# Patient Record
Sex: Female | Born: 1960 | Race: White | Hispanic: No | Smoking: Current every day smoker
Health system: Southern US, Community
[De-identification: ages and names within clinical notes are randomized; demographics above are authoritative.]

## PROBLEM LIST (undated history)

## (undated) DIAGNOSIS — J439 Emphysema, unspecified: Secondary | ICD-10-CM

## (undated) HISTORY — DX: Emphysema, unspecified: J43.9

---

## 2019-05-19 ENCOUNTER — Emergency Department (HOSPITAL_COMMUNITY): Payer: Medicaid Other

## 2019-05-19 ENCOUNTER — Emergency Department (HOSPITAL_COMMUNITY)
Admission: EM | Admit: 2019-05-19 | Discharge: 2019-05-19 | Disposition: A | Discharge status: Home / Self Care | Payer: Medicaid Other | Attending: Emergency Medicine | Admitting: Emergency Medicine

## 2019-05-19 ENCOUNTER — Other Ambulatory Visit: Payer: Self-pay

## 2019-05-19 DIAGNOSIS — Z20822 Contact with and (suspected) exposure to covid-19: Secondary | ICD-10-CM | POA: Diagnosis not present

## 2019-05-19 DIAGNOSIS — R0602 Shortness of breath: Secondary | ICD-10-CM | POA: Diagnosis present

## 2019-05-19 DIAGNOSIS — Z79899 Other long term (current) drug therapy: Secondary | ICD-10-CM | POA: Insufficient documentation

## 2019-05-19 DIAGNOSIS — J069 Acute upper respiratory infection, unspecified: Secondary | ICD-10-CM

## 2019-05-19 LAB — CBC WITH DIFFERENTIAL/PLATELET
Abs Immature Granulocytes: 0.02 10*3/uL (ref 0.00–0.07)
Basophils Absolute: 0.1 10*3/uL (ref 0.0–0.1)
Basophils Relative: 1 %
Eosinophils Absolute: 0.2 10*3/uL (ref 0.0–0.5)
Eosinophils Relative: 2 %
HCT: 46.2 % — ABNORMAL HIGH (ref 36.0–46.0)
Hemoglobin: 15.1 g/dL — ABNORMAL HIGH (ref 12.0–15.0)
Immature Granulocytes: 0 %
Lymphocytes Relative: 24 %
Lymphs Abs: 2 10*3/uL (ref 0.7–4.0)
MCH: 33 pg (ref 26.0–34.0)
MCHC: 32.7 g/dL (ref 30.0–36.0)
MCV: 100.9 fL — ABNORMAL HIGH (ref 80.0–100.0)
Monocytes Absolute: 0.7 10*3/uL (ref 0.1–1.0)
Monocytes Relative: 9 %
Neutro Abs: 5.3 10*3/uL (ref 1.7–7.7)
Neutrophils Relative %: 64 %
Platelets: 216 10*3/uL (ref 150–400)
RBC: 4.58 MIL/uL (ref 3.87–5.11)
RDW: 12.9 % (ref 11.5–15.5)
WBC: 8.3 10*3/uL (ref 4.0–10.5)
nRBC: 0 % (ref 0.0–0.2)

## 2019-05-19 LAB — RESPIRATORY PANEL BY RT PCR (FLU A&B, COVID)
Influenza A by PCR: NEGATIVE
Influenza B by PCR: NEGATIVE
SARS Coronavirus 2 by RT PCR: NEGATIVE

## 2019-05-19 LAB — BRAIN NATRIURETIC PEPTIDE: B Natriuretic Peptide: 34.4 pg/mL (ref 0.0–100.0)

## 2019-05-19 LAB — BASIC METABOLIC PANEL
Anion gap: 9 (ref 5–15)
BUN: 16 mg/dL (ref 6–20)
CO2: 27 mmol/L (ref 22–32)
Calcium: 9.3 mg/dL (ref 8.9–10.3)
Chloride: 106 mmol/L (ref 98–111)
Creatinine, Ser: 0.64 mg/dL (ref 0.44–1.00)
GFR calc Af Amer: >60 mL/min (ref >60)
GFR calc non Af Amer: >60 mL/min (ref >60)
Glucose, Bld: 103 mg/dL — ABNORMAL HIGH (ref 70–99)
Potassium: 4 mmol/L (ref 3.5–5.1)
Sodium: 142 mmol/L (ref 135–145)

## 2019-05-19 LAB — BLOOD GAS, VENOUS
Acid-Base Excess: 3 mmol/L — ABNORMAL HIGH (ref 0.0–2.0)
Bicarbonate: 28.2 mmol/L — ABNORMAL HIGH (ref 20.0–28.0)
O2 Saturation: 88.2 %
Patient temperature: 98.6
pCO2, Ven: 47 mmHg (ref 44.0–60.0)
pH, Ven: 7.394 (ref 7.250–7.430)
pO2, Ven: 55.1 mmHg — ABNORMAL HIGH (ref 32.0–45.0)

## 2019-05-19 LAB — POC SARS CORONAVIRUS 2 AG -  ED: SARS Coronavirus 2 Ag: NEGATIVE

## 2019-05-19 LAB — TROPONIN I (HIGH SENSITIVITY): Troponin I (High Sensitivity): 6 ng/L (ref <18)

## 2019-05-19 MED ORDER — MAGIC MOUTHWASH
10.0000 mL | Freq: Once | ORAL | Status: AC
  Administered 2019-05-19: 10 mL via ORAL
  Filled 2019-05-19: qty 10

## 2019-05-19 MED ORDER — BENZONATATE 100 MG PO CAPS: 100.0000 mg | ORAL_CAPSULE | Freq: Two times a day (BID) | ORAL | 0 refills | Status: AC | PRN

## 2019-05-19 MED ORDER — ALBUTEROL SULFATE HFA 108 (90 BASE) MCG/ACT IN AERS
6.0000 | INHALATION_SPRAY | Freq: Once | RESPIRATORY_TRACT | Status: AC
  Administered 2019-05-19: 6 via RESPIRATORY_TRACT
  Filled 2019-05-19: qty 6.7

## 2019-05-19 NOTE — Discharge Instructions (Addendum)
I suspect he may be suffering from a viral infection, which is worsening your chronic COPD.  Your blood work and your chest x-ray were reassuring today.  I did not see any urgent need for hospitalization.  Please follow-up with your primary care doctor about getting set up with a home oxygen.  I do believe you need this for your COPD, which is quite advanced.  I strongly recommend that you make every effort to quit smoking, as this will only continue worsening your condition.  You are under investigation still for COVID-19.  Please self quarantine at home until you get your final results, which may take another day or 2.  You can always call back in for your results or check online on your patient portal.

## 2019-05-19 NOTE — ED Provider Notes (Signed)
[facility]-EMERGENCY DEPT Provider Note   CSN: [id number] Arrival date & time: 05/19/19  1453     History Chief Complaint  Patient presents with  . Cough  . Sore Throat  . Emesis    Danielle Wong is a 59 y.o. female history of COPD on 2 L nasal cannula as needed at baseline, emphysema, smoking, presenting to the emergency department with cough and congestion and sore throat.  She reports symptoms ongoing for approximately 2 weeks.  She reports a loss of taste.  She says she has had a bad sore throat and also been nauseated and vomited a few times.  Says she had a very persistent cough as well as heaviness in her chest.  Initially the heaviness was only associated with coughing, but for the past day or 2 has become persistent.  She feels short of breath all the time.  She has been using her nebulizer machine with little improvement.  She says that she has not had her oxygen machine since moving here from [place] approximately 2 weeks ago.  She only used it as needed at home.  She continues to smoke about half a pack of cigarettes per day.  She is been a lifelong heavy smoker.  She denies any history of heart attack or cardiac stents.   She reports a history of high blood pressure.    NKDA  HPI     No past medical history on file.  There are no problems to display for this patient.    OB History   No obstetric history on file.     No family history on file.  Social History   Tobacco Use  . Smoking status: Not on file  Substance Use Topics  . Alcohol use: Not on file  . Drug use: Not on file    Home Medications Prior to Admission medications   Medication Sig Start Date End Date Taking? Authorizing Provider  albuterol (VENTOLIN HFA) 108 (90 Base) MCG/ACT inhaler Inhale 2 puffs into the lungs every 6 (six) hours as needed for wheezing or shortness of breath.   Yes [provider]  ALPRAZolam Prudy Feeler) 1 MG tablet Take 1 mg by mouth 3  (three) times daily.  01/03/19  Yes [provider]  aspirin-acetaminophen-caffeine (EXCEDRIN MIGRAINE) [id number] MG tablet Take 2 tablets by mouth every 6 (six) hours as needed for headache.   Yes [provider]  DULoxetine (CYMBALTA) 60 MG capsule Take 60 mg by mouth daily.  02/15/19 03/06/20 Yes [provider]  esomeprazole (NEXIUM) 40 MG capsule Take 40 mg by mouth daily.  01/17/19  Yes [provider]  fluticasone (FLOVENT HFA) 110 MCG/ACT inhaler Inhale 2 puffs into the lungs in the morning and at bedtime.  05/14/19  Yes [provider]  QUEtiapine (SEROQUEL) 200 MG tablet Take 600 mg by mouth at bedtime.   Yes [provider]  SUMAtriptan (IMITREX) 50 MG tablet Take 50 mg by mouth every 2 (two) hours as needed for migraine or headache.  02/26/19  Yes [provider]  benzonatate (TESSALON) 100 MG capsule Take 1 capsule (100 mg total) by mouth 2 (two) times daily as needed for up to 30 doses for cough. 05/19/19   Terald Sleeper, MD    Allergies    Topamax [topiramate]  Review of Systems   Review of Systems  Constitutional: Positive for appetite change, chills, fatigue and fever.  HENT: Positive for congestion, sore throat and trouble swallowing.  Respiratory: Positive for cough, chest tightness and shortness of breath.   Cardiovascular: Negative for chest pain and palpitations.  Gastrointestinal: Positive for nausea and vomiting. Negative for abdominal pain.  Genitourinary: Negative for dysuria and hematuria.  Musculoskeletal: Positive for arthralgias and myalgias.  Skin: Negative for color change and rash.  Neurological: Positive for light-headedness and headaches. Negative for seizures and syncope.  All other systems reviewed and are negative.   Physical Exam Updated Vital Signs BP (!) 157/101   Pulse 83   Temp 98.2 F (36.8 C) (Oral)   Resp (!) 22   Ht 5\' 5"  (1.651 m)   Wt 54.4 kg   SpO2 (!) 89%   BMI  19.97 kg/m   Physical Exam Vitals and nursing note reviewed.  Constitutional:      General: She is not in acute distress.    Appearance: She is well-developed.  HENT:     Head: Normocephalic and atraumatic.     Comments: Oropharynx clear, no tonsillar exudates or swelling, uvula midline, voice is non-muffled and clear, no drooling Eyes:     Conjunctiva/sclera: Conjunctivae normal.  Cardiovascular:     Rate and Rhythm: Normal rate and regular rhythm.     Heart sounds: Normal heart sounds.  Pulmonary:     Effort: Pulmonary effort is normal.     Comments: Moving air poorly in lungs Speaking comfortably in full sentences No crackles Mild expiratory wheezing Abdominal:     Palpations: Abdomen is soft.     Tenderness: There is no abdominal tenderness.  Musculoskeletal:     Cervical back: Neck supple.  Skin:    General: Skin is warm and dry.  Neurological:     Mental Status: She is alert.     ED Results / Procedures / Treatments   Labs (all labs ordered are listed, but only abnormal results are displayed) Labs Reviewed  CBC WITH DIFFERENTIAL/PLATELET - Abnormal; Notable for the following components:      Result Value   Hemoglobin 15.1 (*)    HCT 46.2 (*)    MCV 100.9 (*)    All other components within normal limits  BASIC METABOLIC PANEL - Abnormal; Notable for the following components:   Glucose, Bld 103 (*)    All other components within normal limits  BLOOD GAS, VENOUS - Abnormal; Notable for the following components:   pO2, Ven 55.1 (*)    Bicarbonate 28.2 (*)    Acid-Base Excess 3.0 (*)    All other components within normal limits  RESPIRATORY PANEL BY RT PCR (FLU A&B, COVID)  BRAIN NATRIURETIC PEPTIDE  POC SARS CORONAVIRUS 2 AG -  ED  I-STAT VENOUS BLOOD GAS, ED  TROPONIN I (HIGH SENSITIVITY)    EKG None  Radiology DG Chest Portable 1 View  Result Date: 05/19/2019 CLINICAL DATA:  Short of breath.  Low-grade fever. EXAM: PORTABLE CHEST 1 VIEW COMPARISON:   None. FINDINGS: Cardiac silhouette normal in size. No mediastinal or hilar masses. No evidence of adenopathy. Lungs are hyperexpanded, but clear. No pleural effusion or pneumothorax. Skeletal structures are grossly intact. IMPRESSION: 1. No acute cardiopulmonary disease. 2. Hyperexpanded lungs suggests COPD. Electronically Signed   By: 05/21/2019 M.D.   On: 05/19/2019 17:00    Procedures Procedures (including critical care time)  Medications Ordered in ED Medications  albuterol (VENTOLIN HFA) 108 (90 Base) MCG/ACT inhaler 6 puff (6 puffs Inhalation Given 05/19/19 1804)  magic mouthwash (10 mLs Oral Given 05/19/19 1807)    ED Course  I have reviewed the triage vital signs and the nursing notes.  Pertinent labs & imaging results that were available during my care of the patient were reviewed by me and considered in my medical decision making (see chart for details).  59 year old female COPD presenting to the ED with viral type syndrome for the past 2 weeks.  She feels great COPD is getting worse.  She also reports chest pressure for the past few days, usually associated with coughing but not persistent.  She is not in acute respiratory distress.  She is stable on room air.  Obtain blood work including checking hemoglobin, EKG and troponin.  We will get a chest x-ray.  Her rapid Covid test was negative.  However still of a high suspicion for COVID-19 given her loss of smell and taste, will follow this up with a Covid PCR.  Given her medical comorbidities and her severe COPD or emphysema, believe it is reasonable to get a rapid Covid test to confirm this diagnosis.    Clinical Course as of May 18 2237  Sat May 19, 2019  1704 IMPRESSION: 1. No acute cardiopulmonary disease. 2. Hyperexpanded lungs suggests COPD.   [MT]  2029 Labs are unremarkable   [MT]  2046 Stable at 90% on room air resting in bed.  She tells me her walking pulse ox can be in the low 80's, this is clearly not a new  drop for her, but likely related to her significant COPD.  She'll call her PCP tomorrow to discuss home O2.   [MT]    Clinical Course User Index [MT] Langston Masker Carola Rhine, MD    Final Clinical Impression(s) / ED Diagnoses Final diagnoses:  Shortness of breath  Upper respiratory tract infection, unspecified type    Rx / DC Orders ED Discharge Orders         Ordered    benzonatate (TESSALON) 100 MG capsule  2 times daily PRN     05/19/19 2052           Wyvonnia Dusky, MD 05/19/19 2239

## 2019-05-19 NOTE — ED Notes (Signed)
Patient was verbalized discharge instructions. Pt had no further questions at this time. NAD. 

## 2019-05-19 NOTE — ED Triage Notes (Signed)
Per patient, she has COPD at baseline, states "this feels way worse than COPD". Patient reports sore throat, nausea, emesis, loss of taste. Patient reported low grade fever PTA. Patient VS WNL in triage. Temp 98.2

## 2019-05-19 NOTE — ED Notes (Signed)
Patient ambulated to restroom without assistance. Gait steady. 

## 2019-05-19 NOTE — Progress Notes (Addendum)
TOC CM spoke to pt and states her PCP, Dr Eusebio Friendly. Pt states she was on oxygen when she left [place], but sent oxygen back when she moved to [place]. Updated referring ED provider. Waiting oxygen saturation. Unable to verify for DME provider coverage from ED setting after hours for oxygen. Isidoro Donning RN CCM, [facility] ED TOC CM [phone]

## 2019-10-19 ENCOUNTER — Emergency Department (HOSPITAL_COMMUNITY): Payer: Medicaid Other

## 2019-10-19 ENCOUNTER — Other Ambulatory Visit: Payer: Self-pay

## 2019-10-19 ENCOUNTER — Inpatient Hospital Stay (HOSPITAL_COMMUNITY)
Admission: EM | Admit: 2019-10-19 | Discharge: 2019-10-23 | DRG: 064 | Disposition: A | Discharge status: Home Health | Payer: Medicaid Other | Attending: Internal Medicine | Admitting: Internal Medicine

## 2019-10-19 ENCOUNTER — Encounter (HOSPITAL_COMMUNITY): Payer: Self-pay | Admitting: Emergency Medicine

## 2019-10-19 DIAGNOSIS — S60221A Contusion of right hand, initial encounter: Secondary | ICD-10-CM | POA: Diagnosis present

## 2019-10-19 DIAGNOSIS — R609 Edema, unspecified: Secondary | ICD-10-CM

## 2019-10-19 DIAGNOSIS — J9621 Acute and chronic respiratory failure with hypoxia: Secondary | ICD-10-CM | POA: Diagnosis not present

## 2019-10-19 DIAGNOSIS — I6340 Cerebral infarction due to embolism of unspecified cerebral artery: Secondary | ICD-10-CM | POA: Diagnosis present

## 2019-10-19 DIAGNOSIS — F1410 Cocaine abuse, uncomplicated: Secondary | ICD-10-CM | POA: Diagnosis not present

## 2019-10-19 DIAGNOSIS — S40211A Abrasion of right shoulder, initial encounter: Secondary | ICD-10-CM | POA: Diagnosis present

## 2019-10-19 DIAGNOSIS — R112 Nausea with vomiting, unspecified: Secondary | ICD-10-CM

## 2019-10-19 DIAGNOSIS — F17210 Nicotine dependence, cigarettes, uncomplicated: Secondary | ICD-10-CM | POA: Diagnosis present

## 2019-10-19 DIAGNOSIS — F1310 Sedative, hypnotic or anxiolytic abuse, uncomplicated: Secondary | ICD-10-CM | POA: Diagnosis present

## 2019-10-19 DIAGNOSIS — Z79899 Other long term (current) drug therapy: Secondary | ICD-10-CM

## 2019-10-19 DIAGNOSIS — I6389 Other cerebral infarction: Secondary | ICD-10-CM | POA: Diagnosis not present

## 2019-10-19 DIAGNOSIS — I10 Essential (primary) hypertension: Secondary | ICD-10-CM | POA: Diagnosis present

## 2019-10-19 DIAGNOSIS — R296 Repeated falls: Secondary | ICD-10-CM | POA: Diagnosis present

## 2019-10-19 DIAGNOSIS — N179 Acute kidney failure, unspecified: Secondary | ICD-10-CM | POA: Diagnosis present

## 2019-10-19 DIAGNOSIS — J9811 Atelectasis: Secondary | ICD-10-CM | POA: Diagnosis present

## 2019-10-19 DIAGNOSIS — Z720 Tobacco use: Secondary | ICD-10-CM | POA: Diagnosis present

## 2019-10-19 DIAGNOSIS — W19XXXA Unspecified fall, initial encounter: Secondary | ICD-10-CM | POA: Diagnosis present

## 2019-10-19 DIAGNOSIS — D72829 Elevated white blood cell count, unspecified: Secondary | ICD-10-CM | POA: Diagnosis present

## 2019-10-19 DIAGNOSIS — R569 Unspecified convulsions: Secondary | ICD-10-CM | POA: Diagnosis not present

## 2019-10-19 DIAGNOSIS — I639 Cerebral infarction, unspecified: Secondary | ICD-10-CM

## 2019-10-19 DIAGNOSIS — R413 Other amnesia: Secondary | ICD-10-CM | POA: Diagnosis present

## 2019-10-19 DIAGNOSIS — R680 Hypothermia, not associated with low environmental temperature: Secondary | ICD-10-CM | POA: Diagnosis present

## 2019-10-19 DIAGNOSIS — F1910 Other psychoactive substance abuse, uncomplicated: Secondary | ICD-10-CM | POA: Diagnosis present

## 2019-10-19 DIAGNOSIS — R4182 Altered mental status, unspecified: Secondary | ICD-10-CM | POA: Diagnosis not present

## 2019-10-19 DIAGNOSIS — G43909 Migraine, unspecified, not intractable, without status migrainosus: Secondary | ICD-10-CM | POA: Diagnosis present

## 2019-10-19 DIAGNOSIS — G8321 Monoplegia of upper limb affecting right dominant side: Secondary | ICD-10-CM | POA: Diagnosis present

## 2019-10-19 DIAGNOSIS — J439 Emphysema, unspecified: Secondary | ICD-10-CM | POA: Diagnosis present

## 2019-10-19 DIAGNOSIS — Z888 Allergy status to other drugs, medicaments and biological substances status: Secondary | ICD-10-CM

## 2019-10-19 DIAGNOSIS — S0011XA Contusion of right eyelid and periocular area, initial encounter: Secondary | ICD-10-CM | POA: Diagnosis present

## 2019-10-19 DIAGNOSIS — M25511 Pain in right shoulder: Secondary | ICD-10-CM | POA: Diagnosis present

## 2019-10-19 DIAGNOSIS — Z20822 Contact with and (suspected) exposure to covid-19: Secondary | ICD-10-CM | POA: Diagnosis present

## 2019-10-19 DIAGNOSIS — Y9259 Other trade areas as the place of occurrence of the external cause: Secondary | ICD-10-CM

## 2019-10-19 DIAGNOSIS — S60511A Abrasion of right hand, initial encounter: Secondary | ICD-10-CM | POA: Diagnosis present

## 2019-10-19 DIAGNOSIS — F419 Anxiety disorder, unspecified: Secondary | ICD-10-CM | POA: Diagnosis present

## 2019-10-19 DIAGNOSIS — Z0189 Encounter for other specified special examinations: Secondary | ICD-10-CM

## 2019-10-19 DIAGNOSIS — F14188 Cocaine abuse with other cocaine-induced disorder: Secondary | ICD-10-CM | POA: Diagnosis present

## 2019-10-19 DIAGNOSIS — R29700 NIHSS score 0: Secondary | ICD-10-CM | POA: Diagnosis present

## 2019-10-19 DIAGNOSIS — I776 Arteritis, unspecified: Secondary | ICD-10-CM | POA: Diagnosis present

## 2019-10-19 DIAGNOSIS — Z9981 Dependence on supplemental oxygen: Secondary | ICD-10-CM

## 2019-10-19 DIAGNOSIS — J40 Bronchitis, not specified as acute or chronic: Secondary | ICD-10-CM | POA: Diagnosis present

## 2019-10-19 DIAGNOSIS — E876 Hypokalemia: Secondary | ICD-10-CM | POA: Diagnosis present

## 2019-10-19 DIAGNOSIS — G248 Other dystonia: Secondary | ICD-10-CM | POA: Diagnosis present

## 2019-10-19 DIAGNOSIS — Z1389 Encounter for screening for other disorder: Secondary | ICD-10-CM

## 2019-10-19 DIAGNOSIS — G92 Toxic encephalopathy: Secondary | ICD-10-CM | POA: Diagnosis present

## 2019-10-19 DIAGNOSIS — H9190 Unspecified hearing loss, unspecified ear: Secondary | ICD-10-CM | POA: Diagnosis present

## 2019-10-19 DIAGNOSIS — G936 Cerebral edema: Secondary | ICD-10-CM | POA: Diagnosis present

## 2019-10-19 DIAGNOSIS — R7401 Elevation of levels of liver transaminase levels: Secondary | ICD-10-CM | POA: Diagnosis present

## 2019-10-19 DIAGNOSIS — S60811A Abrasion of right wrist, initial encounter: Secondary | ICD-10-CM | POA: Diagnosis present

## 2019-10-19 DIAGNOSIS — R270 Ataxia, unspecified: Secondary | ICD-10-CM | POA: Diagnosis present

## 2019-10-19 LAB — SALICYLATE LEVEL: Salicylate Lvl: <7 mg/dL — ABNORMAL LOW (ref 7.0–30.0)

## 2019-10-19 LAB — COMPREHENSIVE METABOLIC PANEL
ALT: 49 U/L — ABNORMAL HIGH (ref 0–44)
AST: 70 U/L — ABNORMAL HIGH (ref 15–41)
Albumin: 4.2 g/dL (ref 3.5–5.0)
Alkaline Phosphatase: 75 U/L (ref 38–126)
Anion gap: 15 (ref 5–15)
BUN: 33 mg/dL — ABNORMAL HIGH (ref 6–20)
CO2: 24 mmol/L (ref 22–32)
Calcium: 9 mg/dL (ref 8.9–10.3)
Chloride: 102 mmol/L (ref 98–111)
Creatinine, Ser: 1.37 mg/dL — ABNORMAL HIGH (ref 0.44–1.00)
GFR calc Af Amer: 49 mL/min — ABNORMAL LOW (ref >60)
GFR calc non Af Amer: 42 mL/min — ABNORMAL LOW (ref >60)
Glucose, Bld: 98 mg/dL (ref 70–99)
Potassium: 4.6 mmol/L (ref 3.5–5.1)
Sodium: 141 mmol/L (ref 135–145)
Total Bilirubin: 0.4 mg/dL (ref 0.3–1.2)
Total Protein: 7 g/dL (ref 6.5–8.1)

## 2019-10-19 LAB — URINALYSIS, ROUTINE W REFLEX MICROSCOPIC
Bacteria, UA: NONE SEEN
Bilirubin Urine: NEGATIVE
Glucose, UA: NEGATIVE mg/dL
Ketones, ur: 5 mg/dL — AB
Leukocytes,Ua: NEGATIVE
Nitrite: NEGATIVE
Protein, ur: NEGATIVE mg/dL
Specific Gravity, Urine: 1.013 (ref 1.005–1.030)
pH: 5 (ref 5.0–8.0)

## 2019-10-19 LAB — CBC
HCT: 45.1 % (ref 36.0–46.0)
Hemoglobin: 13.8 g/dL (ref 12.0–15.0)
MCH: 31.4 pg (ref 26.0–34.0)
MCHC: 30.6 g/dL (ref 30.0–36.0)
MCV: 102.5 fL — ABNORMAL HIGH (ref 80.0–100.0)
Platelets: 192 10*3/uL (ref 150–400)
RBC: 4.4 MIL/uL (ref 3.87–5.11)
RDW: 14.4 % (ref 11.5–15.5)
WBC: 14.6 10*3/uL — ABNORMAL HIGH (ref 4.0–10.5)
nRBC: 0 % (ref 0.0–0.2)

## 2019-10-19 LAB — I-STAT BETA HCG BLOOD, ED (MC, WL, AP ONLY): I-stat hCG, quantitative: <5 m[IU]/mL (ref <5)

## 2019-10-19 LAB — ETHANOL: Alcohol, Ethyl (B): <10 mg/dL (ref <10)

## 2019-10-19 LAB — RAPID URINE DRUG SCREEN, HOSP PERFORMED
Amphetamines: NOT DETECTED
Barbiturates: NOT DETECTED
Benzodiazepines: POSITIVE — AB
Cocaine: POSITIVE — AB
Opiates: NOT DETECTED
Tetrahydrocannabinol: NOT DETECTED

## 2019-10-19 LAB — SARS CORONAVIRUS 2 BY RT PCR (HOSPITAL ORDER, PERFORMED IN ~~LOC~~ HOSPITAL LAB): SARS Coronavirus 2: NEGATIVE

## 2019-10-19 LAB — CBG MONITORING, ED: Glucose-Capillary: 85 mg/dL (ref 70–99)

## 2019-10-19 LAB — ACETAMINOPHEN LEVEL: Acetaminophen (Tylenol), Serum: <10 ug/mL — ABNORMAL LOW (ref 10–30)

## 2019-10-19 MED ORDER — SODIUM CHLORIDE 0.9 % IV SOLN: INTRAVENOUS | Status: DC

## 2019-10-19 MED ORDER — ACETAMINOPHEN 650 MG RE SUPP: 650.0000 mg | RECTAL | Status: DC | PRN

## 2019-10-19 MED ORDER — METOCLOPRAMIDE HCL 5 MG/ML IJ SOLN
5.0000 mg | Freq: Once | INTRAMUSCULAR | Status: AC
  Administered 2019-10-19: 5 mg via INTRAVENOUS
  Filled 2019-10-19: qty 2

## 2019-10-19 MED ORDER — SENNOSIDES-DOCUSATE SODIUM 8.6-50 MG PO TABS: 1.0000 | ORAL_TABLET | Freq: Every evening | ORAL | Status: DC | PRN

## 2019-10-19 MED ORDER — LORAZEPAM 1 MG PO TABS
1.0000 mg | ORAL_TABLET | Freq: Four times a day (QID) | ORAL | Status: DC | PRN
  Administered 2019-10-20: 1 mg via ORAL
  Filled 2019-10-19: qty 1

## 2019-10-19 MED ORDER — ENOXAPARIN SODIUM 30 MG/0.3ML ~~LOC~~ SOLN
30.0000 mg | SUBCUTANEOUS | Status: DC
  Administered 2019-10-20 – 2019-10-22 (×3): 30 mg via SUBCUTANEOUS
  Filled 2019-10-19 (×3): qty 0.3

## 2019-10-19 MED ORDER — ATORVASTATIN CALCIUM 40 MG PO TABS
40.0000 mg | ORAL_TABLET | Freq: Every day | ORAL | Status: DC
  Administered 2019-10-20: 40 mg via ORAL
  Filled 2019-10-19: qty 1

## 2019-10-19 MED ORDER — ACETAMINOPHEN 325 MG PO TABS
650.0000 mg | ORAL_TABLET | ORAL | Status: DC | PRN
  Administered 2019-10-20 – 2019-10-21 (×3): 650 mg via ORAL
  Filled 2019-10-19 (×3): qty 2

## 2019-10-19 MED ORDER — STROKE: EARLY STAGES OF RECOVERY BOOK: Freq: Once | Status: DC

## 2019-10-19 MED ORDER — ONDANSETRON HCL 4 MG/2ML IJ SOLN
4.0000 mg | Freq: Once | INTRAMUSCULAR | Status: AC
  Administered 2019-10-19: 4 mg via INTRAVENOUS
  Filled 2019-10-19: qty 2

## 2019-10-19 MED ORDER — SODIUM CHLORIDE 0.9 % IV BOLUS
1000.0000 mL | Freq: Once | INTRAVENOUS | Status: AC
  Administered 2019-10-19: 1000 mL via INTRAVENOUS

## 2019-10-19 MED ORDER — ACETAMINOPHEN 160 MG/5ML PO SOLN: 650.0000 mg | ORAL | Status: DC | PRN
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
## 2019-10-19 NOTE — Consult Note (Signed)
Referring Physician: Dr. Freida Busman    Chief Complaint: AMS  HPI: Danielle Wong is an 59 y.o. female who presented to the [facility] ED from home via EMS on Friday afternoon. LKN was on Thursday at "supper time" per daughter. Her daughter attempted to contact the patient this morning unsuccessfully and then called EMS. EMS found patient sitting in a chair confused, only alert to name and birthday. Right eyebrow hematoma was noted. Per daughter, patient stated falling a lot on the day of admission. Daughter told EMS that the patient has a history of Xanax abuse and COPD. The patient has been staying at a hotel in [place] for the past 3 months since moving from [place]. She states she moved to [facility] for a "change of scene" and is looking for a permanent home here.   On arrival to the ED, the patient was disoriented, being unsure of current year as well as the current president.  She had difficulty answering questions, but was able to follow commands. The patient told EDP that she woke up in the morning feeling dizzy and had multiple falls but was unable to provide details. She also gave the same answers to the Neurologist, endorsing memory loss for recent events. She had multiple abrasions to her extremities in various stages of healing as well as a small hematoma above her right eye.   CT head revealed symmetric decreased attenuation in each posterior mid-cerebellar hemisphere. Possible edema was seen in the superolateral aspect of the left parietal lobe. There was concern for multifocal infarcts. Calcification was noted in the left carotid siphon region. Foci of carotid artery calcification were noted bilaterally in the neck.  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 Minimal chronic microvascular ischemic changes.  LSN: Thursday night tPA Given: No: Out of  time window  History reviewed. No pertinent past medical history.  History reviewed. No pertinent surgical history.  No family history on file. Social History:  has no history on file for tobacco use, alcohol use, and drug use.  Allergies:  Allergies  Allergen Reactions  . Topamax [Topiramate] Other (See Comments)    Dizziness     Home Medications:  No current facility-administered medications on file prior to encounter.   Current Outpatient Medications on File Prior to Encounter  Medication Sig Dispense Refill  . albuterol (VENTOLIN HFA) 108 (90 Base) MCG/ACT inhaler Inhale 2 puffs into the lungs every 6 (six) hours as needed for wheezing or shortness of breath.    . ALPRAZolam (XANAX) 1 MG tablet Take 1 mg by mouth 3 (three) times daily.     Marland Kitchen amLODipine (NORVASC) 5 MG tablet Take 5 mg by mouth daily.    Marland Kitchen aspirin-acetaminophen-caffeine (EXCEDRIN MIGRAINE) 250-250-65 MG tablet Take 2 tablets by mouth every 6 (six) hours as needed for headache.    . benzonatate (TESSALON) 100 MG capsule Take 1 capsule (100 mg total) by mouth 2 (two) times daily as needed for up to 30 doses for cough. 30 capsule 0  . DULoxetine (CYMBALTA) 60 MG capsule Take 60 mg by mouth 2 (two) times daily.     Marland Kitchen esomeprazole (NEXIUM) 40 MG capsule Take 40 mg by mouth daily.     . fluticasone (FLOVENT HFA) 110 MCG/ACT inhaler Inhale 2 puffs into the lungs 2 (two) times daily.     Marland Kitchen linaclotide (LINZESS) 72 MCG capsule Take 72 mcg by  mouth daily before breakfast.    . QUEtiapine (SEROQUEL) 200 MG tablet Take 600 mg by mouth at bedtime.    . SUMAtriptan (IMITREX) 50 MG tablet Take 50 mg by mouth every 2 (two) hours as needed for migraine or headache.        Hospital Scheduled Medications: .  stroke: mapping our early stages of recovery book   Does not apply Once  . atorvastatin  40 mg Oral Daily  . enoxaparin (LOVENOX) injection  30 mg Subcutaneous Q24H  . nicotine  21 mg Transdermal Daily   Continuous: .  sodium chloride 125 mL/hr at 10/20/19 0044    ROS: Has a bifrontal throbbing 10/10 headache. Endorses having lacerations and bruises, but cannot remember how she got them. Does not have any other complaints, but poor memory and insight limit accuracy of ROS.  Physical Examination: Blood pressure (!) 142/89, pulse 99, temperature 97.6 F (36.4 C), temperature source Oral, resp. rate (!) 22, weight 54.4 kg, SpO2 95 %.  HEENT: [place]/AT. No scleral injection or periorbital bruising. No bruises or abrasions to neck. Lungs: Respirations unlabored.  Ext: Abrasion and bruising to right hand/wrist as well as lower extremities. No pitting edema.   Neurologic Examination: Mental Status: Awake and alert. Not oriented to city, state, day of week, year or month. Appears confused and distressed when trying to recollect orientation information. Speech fluent and nondysarthric. Naming intact. Able to follow all basic commands. Could only perform 2 out of 3 steps of a directional multistep command. Anxious affect.  Cranial Nerves: II:  Visual fields intact bilaterally. No extinction to DSS. PERRL.  III,IV, VI: No ptosis. EOMI. No nystagmus.  V,VII: Smile symmetric, facial temp sensation equal bilaterally VIII: Hearing intact to voice IX,X: Palate rises symmetrically XI: Symmetric. Low amplitude head tremor is noted when sitting upright, but not when supine. Subtle dystonic posturing of neck at times.  XII: Midline tongue extension  Motor: RUE 4/5 proximally and distally with dystonic posturing of wrist and digits.  LUE 5/5 proximally and distally BLE 5/5 No pronator drift in the context of dystonic posturing of right hand.  Sensory: Temp subjectively intact x 4. Light touch sensation absent to RUE. Severely decreased pressure sensation to RUE. Light touch decreased to RLE. No extinction with DSS to lower extremities.   Deep Tendon Reflexes:  3+ bilateral brachioradialis and biceps 3+ bilateral  patellae Trace achilles bilaterally Toes equivocal bilaterally  Cerebellar: Dysmetric FNF with RUE. Slow but non-dysmetric left FNF. No ataxia with H-S bilaterally. Gait: Deferred  Results for orders placed or performed during the hospital encounter of 10/19/19 (from the past 48 hour(s))  Comprehensive metabolic panel     Status: Abnormal   Collection Time: 10/19/19  1:58 PM  Result Value Ref Range   Sodium 141 135 - 145 mmol/L   Potassium 4.6 3.5 - 5.1 mmol/L   Chloride 102 98 - 111 mmol/L   CO2 24 22 - 32 mmol/L   Glucose, Bld 98 70 - 99 mg/dL    Comment: Glucose reference range applies only to samples taken after fasting for at least 8 hours.   BUN 33 (H) 6 - 20 mg/dL   Creatinine, Ser 2.95 (H) 0.44 - 1.00 mg/dL   Calcium 9.0 8.9 - 62.1 mg/dL   Total Protein 7.0 6.5 - 8.1 g/dL   Albumin 4.2 3.5 - 5.0 g/dL   AST 70 (H) 15 - 41 U/L   ALT 49 (H) 0 - 44 U/L   Alkaline  Phosphatase 75 38 - 126 U/L   Total Bilirubin 0.4 0.3 - 1.2 mg/dL   GFR calc non Af Amer 42 (L) >60 mL/min   GFR calc Af Amer 49 (L) >60 mL/min   Anion gap 15 5 - 15    Comment: Performed at [facility], [address] [place], [place] [address]  CBC     Status: Abnormal   Collection Time: 10/19/19  1:58 PM  Result Value Ref Range   WBC 14.6 (H) 4.0 - 10.5 K/uL   RBC 4.40 3.87 - 5.11 MIL/uL   Hemoglobin 13.8 12.0 - 15.0 g/dL   HCT 60.445.1 36 - 46 %   MCV 102.5 (H) 80.0 - 100.0 fL   MCH 31.4 26.0 - 34.0 pg   MCHC 30.6 30.0 - 36.0 g/dL   RDW 54.014.4 98.111.5 - 19.115.5 %   Platelets 192 150 - 400 K/uL   nRBC 0.0 0.0 - 0.2 %    Comment: Performed at [facility], [address] [place], [place] [address]  Salicylate level     Status: Abnormal   Collection Time: 10/19/19  1:58 PM  Result Value Ref Range   Salicylate Lvl <7.0 (L) 7.0 - 30.0 mg/dL    Comment: Performed at [facility], [address] [place], [place] [address]  Ethanol     Status: None   Collection Time: 10/19/19  1:58 PM  Result Value  Ref Range   Alcohol, Ethyl (B) <10 <10 mg/dL    Comment: (NOTE) Lowest detectable limit for serum alcohol is 10 mg/dL.  For medical purposes only. Performed at [facility], [address] [place], [place] [address]   I-Stat beta hCG blood, ED     Status: None   Collection Time: 10/19/19  2:01 PM  Result Value Ref Range   I-stat hCG, quantitative <5.0 <5 mIU/mL   Comment 3            Comment:   GEST. AGE      CONC.  (mIU/mL)   <=1 WEEK        5 - 50     2 WEEKS       50 - 500     3 WEEKS       100 - 10,000     4 WEEKS     1,000 - 30,000        FEMALE AND NON-PREGNANT FEMALE:     LESS THAN 5 mIU/mL   CBG monitoring, ED     Status: None   Collection Time: 10/19/19  3:03 PM  Result Value Ref Range   Glucose-Capillary 85 70 - 99 mg/dL    Comment: Glucose reference range applies only to samples taken after fasting for at least 8 hours.  SARS Coronavirus 2 by RT PCR (hospital order, performed in [facility] hospital lab) Nasopharyngeal Nasopharyngeal Swab     Status: None   Collection Time: 10/19/19  4:02 PM   Specimen: Nasopharyngeal Swab  Result Value Ref Range   SARS Coronavirus 2 NEGATIVE NEGATIVE    Comment: (NOTE) SARS-CoV-2 target nucleic acids are NOT DETECTED.  The SARS-CoV-2 RNA is generally detectable in upper and lower respiratory specimens during the acute phase of infection. The lowest concentration of SARS-CoV-2 viral copies this assay can detect is 250 copies / mL. A negative result does not preclude SARS-CoV-2 infection and should not be used as the sole basis for treatment or other patient management  decisions.  A negative result may occur with improper specimen collection / handling, submission of specimen other than nasopharyngeal swab, presence of viral mutation(s) within the areas targeted by this assay, and inadequate number of viral copies (<250 copies / mL). A negative result must be combined with clinical observations, patient history, and  epidemiological information.  Fact Sheet for Patients:   BoilerBrush.com.cy  Fact Sheet for Healthcare Providers: https://pope.com/  This test is not yet approved or  cleared by the [place] FDA and has been authorized for detection and/or diagnosis of SARS-CoV-2 by FDA under an Emergency Use Authorization (EUA).  This EUA will remain in effect (meaning this test can be used) for the duration of the COVID-19 declaration under Section 564(b)(1) of the Act, 21 U.S.C. section 360bbb-3(b)(1), unless the authorization is terminated or revoked sooner.  Performed at [facility], [address], [place], [place] [address]   Urinalysis, Routine w reflex microscopic Urine, Catheterized     Status: Abnormal   Collection Time: 10/19/19  8:09 PM  Result Value Ref Range   Color, Urine YELLOW YELLOW   APPearance CLEAR CLEAR   Specific Gravity, Urine 1.013 1.005 - 1.030   pH 5.0 5.0 - 8.0   Glucose, UA NEGATIVE NEGATIVE mg/dL   Hgb urine dipstick SMALL (A) NEGATIVE   Bilirubin Urine NEGATIVE NEGATIVE   Ketones, ur 5 (A) NEGATIVE mg/dL   Protein, ur NEGATIVE NEGATIVE mg/dL   Nitrite NEGATIVE NEGATIVE   Leukocytes,Ua NEGATIVE NEGATIVE   RBC / HPF 0-5 0 - 5 RBC/hpf   WBC, UA 0-5 0 - 5 WBC/hpf   Bacteria, UA NONE SEEN NONE SEEN   Squamous Epithelial / LPF 0-5 0 - 5   Mucus PRESENT     Comment: Performed at [facility], [address], [place], [place] [address]  Urine rapid drug screen (hosp performed)     Status: Abnormal   Collection Time: 10/19/19  8:09 PM  Result Value Ref Range   Opiates NONE DETECTED NONE DETECTED   Cocaine POSITIVE (A) NONE DETECTED   Benzodiazepines POSITIVE (A) NONE DETECTED   Amphetamines NONE DETECTED NONE DETECTED   Tetrahydrocannabinol NONE DETECTED NONE DETECTED   Barbiturates NONE DETECTED NONE DETECTED    Comment: (NOTE) DRUG SCREEN FOR MEDICAL PURPOSES ONLY.  IF CONFIRMATION IS  NEEDED FOR ANY PURPOSE, NOTIFY LAB WITHIN 5 DAYS.  LOWEST DETECTABLE LIMITS FOR URINE DRUG SCREEN Drug Class                     Cutoff (ng/mL) Amphetamine and metabolites    1000 Barbiturate and metabolites    200 Benzodiazepine                 200 Tricyclics and metabolites     300 Opiates and metabolites        300 Cocaine and metabolites        300 THC                            50 Performed at [facility], [address], [place], [place] [address]   Acetaminophen level     Status: Abnormal   Collection Time: 10/19/19  9:59 PM  Result Value Ref Range   Acetaminophen (Tylenol), Serum <10 (L) 10 - 30 ug/mL    Comment: (NOTE) Therapeutic concentrations vary significantly. A range of 10-30 ug/mL  may be an effective concentration for  many patients. However, some  are best treated at concentrations outside of this range. Acetaminophen concentrations >150 ug/mL at 4 hours after ingestion  and >50 ug/mL at 12 hours after ingestion are often associated with  toxic reactions.  Performed at [facility], [address], [place], [place] [address]    DG Pelvis 1-2 Views  Result Date: 10/19/2019 CLINICAL DATA:  Metal screening prior to MRI EXAM: PELVIS - 1-2 VIEW COMPARISON:  None. FINDINGS: No metal foreign body projects over the pelvis. Upper normal amount of stool in the colon. No significant abnormal calcifications. No significant bony abnormality is observed. IMPRESSION: 1. No metal foreign body projects over the pelvis. 2. Upper normal amount of stool in the colon. Electronically Signed   By: Gaylyn Rong M.D.   On: 10/19/2019 18:29   DG Shoulder Right  Result Date: 10/19/2019 CLINICAL DATA:  pain EXAM: RIGHT SHOULDER - 2+ VIEW COMPARISON:  None. FINDINGS: There is no evidence of fracture or dislocation. Mild AC joint degenerative changes. Soft tissues are unremarkable. IMPRESSION: No acute finding in the right shoulder. Electronically Signed   By: Emmaline Kluver M.D.   On: 10/19/2019 16:23   DG Abd 1 View  Result Date: 10/19/2019 CLINICAL DATA:  59 year old female with pre MRI radiograph. Evaluate for metallic foreign object. EXAM: ABDOMEN - 1 VIEW COMPARISON:  None. FINDINGS: No metallic foreign object noted in the abdomen. There is moderate stool throughout the colon. No bowel dilatation or evidence of obstruction. Linear radiopaque foci over the left renal silhouette may represent kidney stone. The osseous structures and soft tissues are unremarkable. IMPRESSION: No metallic foreign object in the abdomen. Electronically Signed   By: Elgie Collard M.D.   On: 10/19/2019 18:29   CT Head Wo Contrast  Result Date: 10/19/2019 CLINICAL DATA:  Pain following trauma.  Altered mental status EXAM: CT HEAD WITHOUT CONTRAST CT CERVICAL SPINE WITHOUT CONTRAST TECHNIQUE: Multidetector CT imaging of the head and cervical spine was performed following the standard protocol without intravenous contrast. Multiplanar CT image reconstructions of the cervical spine were also generated. COMPARISON:  None. FINDINGS: CT HEAD FINDINGS Brain: The ventricles and sulci are normal in size and configuration. There is no appreciable intracranial mass, hemorrhage, extra-axial fluid collection, or midline shift. There is symmetric decreased attenuation in the posterior mid cerebellar hemispheres bilaterally. There is subtle edema in the posterolateral aspect of the left parietal lobe. Concern for potential multifocal infarct given this appearance. Elsewhere brain parenchyma appears unremarkable. Vascular: No appreciable hyperdense vessel. There is calcification in the left carotid siphon. Skull: The bony calvarium appears intact. Sinuses/Orbits: There is diffuse opacification of the sphenoid sinus regions. There is mild mucosal thickening in several ethmoid air cells. No intraorbital lesions evident. Other: Mastoid air cells are clear. CT CERVICAL SPINE FINDINGS Alignment: There is  no appreciable spondylolisthesis. Skull base and vertebrae: Skull base and craniocervical junction regions appear normal. No appreciable fracture. No blastic or lytic bone lesions. Soft tissues and spinal canal: The prevertebral soft tissues and predental space regions are normal. There is no evident cord or canal hematoma. No paraspinous lesions are evident. Disc levels: There is moderate disc space narrowing at C4-5 and C5-6 with moderate narrowing of the anterior aspect of the C3-4 disc. There are anterior osteophytes at C3, C4, C5, and C6. There is facet hypertrophy at multiple levels. There is mild exit foraminal narrowing at C4-5 on the left with impression on the exiting nerve root. There is milder impression  on the exiting nerve roots at C5-6 bilaterally. There is no frank disc extrusion or stenosis. Upper chest: There is evidence of centrilobular emphysematous change in the upper lobes. No upper lung region edema or consolidation. Other: There are foci of carotid artery calcification bilaterally. IMPRESSION: CT head: Symmetric decreased attenuation in each posterior mid cerebellar hemisphere. Suspect edema in the superolateral aspect of the left parietal lobe. Concern for multifocal infarcts. MR correlation advised. No mass or hemorrhage. Calcification noted in the left carotid siphon region. Paranasal sinus disease noted with diffuse opacification of the sphenoid sinus region which may well be chronic given the overall appearance. CT cervical spine: No fracture or spondylolisthesis. Multilevel osteoarthritic change. No disc extrusion or stenosis. Upper lobe emphysematous change noted. Foci of carotid artery calcification noted bilaterally. Electronically Signed   By: Bretta Bang III M.D.   On: 10/19/2019 14:39   CT Cervical Spine Wo Contrast  Result Date: 10/19/2019 CLINICAL DATA:  Pain following trauma.  Altered mental status EXAM: CT HEAD WITHOUT CONTRAST CT CERVICAL SPINE WITHOUT CONTRAST  TECHNIQUE: Multidetector CT imaging of the head and cervical spine was performed following the standard protocol without intravenous contrast. Multiplanar CT image reconstructions of the cervical spine were also generated. COMPARISON:  None. FINDINGS: CT HEAD FINDINGS Brain: The ventricles and sulci are normal in size and configuration. There is no appreciable intracranial mass, hemorrhage, extra-axial fluid collection, or midline shift. There is symmetric decreased attenuation in the posterior mid cerebellar hemispheres bilaterally. There is subtle edema in the posterolateral aspect of the left parietal lobe. Concern for potential multifocal infarct given this appearance. Elsewhere brain parenchyma appears unremarkable. Vascular: No appreciable hyperdense vessel. There is calcification in the left carotid siphon. Skull: The bony calvarium appears intact. Sinuses/Orbits: There is diffuse opacification of the sphenoid sinus regions. There is mild mucosal thickening in several ethmoid air cells. No intraorbital lesions evident. Other: Mastoid air cells are clear. CT CERVICAL SPINE FINDINGS Alignment: There is no appreciable spondylolisthesis. Skull base and vertebrae: Skull base and craniocervical junction regions appear normal. No appreciable fracture. No blastic or lytic bone lesions. Soft tissues and spinal canal: The prevertebral soft tissues and predental space regions are normal. There is no evident cord or canal hematoma. No paraspinous lesions are evident. Disc levels: There is moderate disc space narrowing at C4-5 and C5-6 with moderate narrowing of the anterior aspect of the C3-4 disc. There are anterior osteophytes at C3, C4, C5, and C6. There is facet hypertrophy at multiple levels. There is mild exit foraminal narrowing at C4-5 on the left with impression on the exiting nerve root. There is milder impression on the exiting nerve roots at C5-6 bilaterally. There is no frank disc extrusion or stenosis.  Upper chest: There is evidence of centrilobular emphysematous change in the upper lobes. No upper lung region edema or consolidation. Other: There are foci of carotid artery calcification bilaterally. IMPRESSION: CT head: Symmetric decreased attenuation in each posterior mid cerebellar hemisphere. Suspect edema in the superolateral aspect of the left parietal lobe. Concern for multifocal infarcts. MR correlation advised. No mass or hemorrhage. Calcification noted in the left carotid siphon region. Paranasal sinus disease noted with diffuse opacification of the sphenoid sinus region which may well be chronic given the overall appearance. CT cervical spine: No fracture or spondylolisthesis. Multilevel osteoarthritic change. No disc extrusion or stenosis. Upper lobe emphysematous change noted. Foci of carotid artery calcification noted bilaterally. Electronically Signed   By: Bretta Bang III M.D.   On:  10/19/2019 14:39   MR BRAIN WO CONTRAST  Addendum Date: 10/19/2019   ADDENDUM REPORT: 10/19/2019 21:11 ADDENDUM: These results were called by telephone at the time of interpretation on 10/19/2019 at 8:56 Pm to provider Dr. Fredderick Phenix, who verbally acknowledged these results. Electronically Signed   By: Stana Bunting M.D.   On: 10/19/2019 21:11   Result Date: 10/19/2019 CLINICAL DATA:  Mental status change. EXAM: MRI HEAD WITHOUT CONTRAST TECHNIQUE: Multiplanar, multiecho pulse sequences of the brain and surrounding structures were obtained without intravenous contrast. COMPARISON:  10/19/2019 head CT. 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 Tiny foci of SWI signal dropout within the left parietal region may reflect petechial hemorrhages. Background scattered punctate T2 hyperintensities involving the supratentorial white matter likely reflect  chronic microvascular ischemic changes. No midline shift, ventriculomegaly or extra-axial fluid collection. No mass lesion. Vascular: Normal flow voids. Skull and upper cervical spine: Normal marrow signal. Sinuses/Orbits: Normal orbits. Sphenoid sinus disease. No mastoid effusion. Other: None. 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 Minimal chronic microvascular ischemic changes. Electronically Signed: By: Stana Bunting M.D. On: 10/19/2019 20:52   DG Chest Portable 1 View  Result Date: 10/19/2019 CLINICAL DATA:  COPD, confused EXAM: PORTABLE CHEST 1 VIEW COMPARISON:  None. FINDINGS: Chronic interstitial prominence. No new consolidation or edema. No pleural effusion. Stable cardiomediastinal contours. Possible enlargement of the main pulmonary artery. Normal heart size. IMPRESSION: No acute abnormality in the chest. Electronically Signed   By: Guadlupe Spanish M.D.   On: 10/19/2019 13:51    Assessment: 59 y.o. female presenting with AMS. MRI reveals multifocal DWI-positive lesions.  1. Exam reveals confusion, poor orientation, anterograde memory loss, low amplitude tremor, RUE weakness with dystonia, right side sensory loss, RUE ataxia and brisk reflexes. Multiple bruises and skin abrasions are also noted.  2. MRI brain: Multifocal acute DWI abnormalities involving the left parietal lobe, left centrum semiovale, bilateral globi pallidi, left hippocampus, right occipital lobe and bilateral cerebellar hemispheres. The lesions were interpreted as being due to ischemic infarctions by Radiology. However, the hippocampal lesion, bilateral globus pallidus lesions and bilateral strikingly symmetric cerebellar lesions are more consistent with an acute toxic or metabolic insult (severe hypoglycemia, transient severe hypoxia, carbon monoxide poisoning).   3.  Elevated transaminases. Elevated BUN and Cr. Leukocytosis.  4. Cocaine and benzodiazepine positive. RCVS is on the DDx for her acute DWI lesions given cocaine use.   Recommendations: 1. HgbA1c, fasting lipid panel 2. MRA of the brain without contrast 3. PT consult, OT consult, Speech consult 4. Echocardiogram 5. Carotid dopplers 6. Frequent neuro checks 7. Risk factor modification 8. Telemetry monitoring 9. Hold off on starting ASA until stroke work up is complete.  10. Carboxyhemoglobin level (ordered) 11. Further toxic/metabolic work up, including assessment of possible etiologies for her elevated transaminases, renal dysfunction and leukocytosis.    @Electronically  signed: Dr.  10/19/2019, 11:53 PM

## 2019-10-19 NOTE — ED Notes (Signed)
This RN to room due to pt family stating pt is trying to leave.Pt had pulled off leads and was sitting at the end of the bed. Pt assisted back in bed. Cleaned up bed and floor

## 2019-10-19 NOTE — ED Provider Notes (Signed)
Medical screening examination/treatment/procedure(s) were conducted as a shared visit with non-physician practitioner(s) and myself.  I personally evaluated the patient during the encounter.    59 year old female here with altered mental status.  Patient states that she takes 1 mg Xanax twice a day and has not been using extra.  Patient is sleepy but alert oriented x4.  Will image patient's head and neck as well as check blood work.   Lorre Nick, MD 10/19/19 1331

## 2019-10-19 NOTE — ED Notes (Signed)
This RN to room due to pt fussing at another RN and stating she is going to leave because she can not have water.Pt had pulled off leads and purewick and and was sitting at the end of the bed. Pt assisted back in bed.   Explained to pt that she still has test to be ran and we can not give water at this time.

## 2019-10-19 NOTE — ED Triage Notes (Signed)
Arrived via EMS from home. Daughter called EMS could not get in contact with patient today. LKW yesterday "Supper time" per daughter. EMS found patient sitting in a chair confusion only alert to name and birthday. Right eyebrow hematoma patient stated falling a lot today. Daughter told EMS history of Xanax abuse and COPD. C-collar placed prior to arrival.

## 2019-10-19 NOTE — H&P (Signed)
History and Physical   Danielle Wong IRS:[id number] DOB: 1961-01-14 DOA: 10/19/2019  Referring MD/NP/PA: Dr. Fredderick Phenix  PCP: [place], [place] R, PA-C   Outpatient Specialists: None  Patient coming from: Home  Chief Complaint: Altered mental status  HPI: Danielle Wong is a 59 y.o. female with medical history significant of polysubstance abuse, COPD, hypertension, anxiety disorder, migraine headaches who was last seen normal by her daughter last night.  She could not reach her today and called out to her place which is in [place] that she left side.  She could not get her to respond so EMS was dispatched.  Patient was found sitting in a chair confused not sure where she was.  Brought in to the ER where she was evaluated for altered mental status.  Initial work-up showed urine drug screen positive for benzo diazepam's as well as cocaine.  Head CT without contrast not very expressive but MRI of the brain showed multiple bilateral CVAs.  It appears like a shower type CVA.  Patient is more awake now and alert.  Very anxious.  No focal neurologic findings otherwise.  She has been admitted with these diffuse CVA for evaluation.  ED Course: Temperature 95.1, blood pressure 155/109 pulse 107 respiratory 22 oxygen sat 79% on room air currently 100% 2 L.  White count is 14.6 the rest of CBC within normal.  BUN 33 creatinine 1.37 and the rest of the chemistry within normal.  Urine drug screen positive for benzodiazepine and cocaine.  Urinalysis essentially negative.  Chest x-ray and x-ray of the abdomen showed no acute findings.  MRI of the brain shows multifocal acute infarct involving the left parietal lobe left centrum semiovale bilateral globi pallidi left hippocampus right occipital lobe and bilateral cerebellar hemispheres.  Patient will be admitted to the hospital for evaluation and treatment of acute CVA and polysubstance abuse.  Review of Systems: As per HPI otherwise 10 point review of systems negative.     History reviewed. No pertinent past medical history.  History reviewed. No pertinent surgical history.   has no history on file for tobacco use, alcohol use, and drug use.  Allergies  Allergen Reactions  . Topamax [Topiramate] Other (See Comments)    Dizziness     No family history on file.   Prior to Admission medications   Medication Sig Start Date End Date Taking? Authorizing Provider  albuterol (VENTOLIN HFA) 108 (90 Base) MCG/ACT inhaler Inhale 2 puffs into the lungs every 6 (six) hours as needed for wheezing or shortness of breath.    [provider]  ALPRAZolam Prudy Feeler) 1 MG tablet Take 1 mg by mouth 3 (three) times daily.  01/03/19   [provider]  amLODipine (NORVASC) 5 MG tablet Take 5 mg by mouth daily.    [provider]  aspirin-acetaminophen-caffeine (EXCEDRIN MIGRAINE) [phone] MG tablet Take 2 tablets by mouth every 6 (six) hours as needed for headache.    [provider]  benzonatate (TESSALON) 100 MG capsule Take 1 capsule (100 mg total) by mouth 2 (two) times daily as needed for up to 30 doses for cough. 05/19/19   Terald Sleeper, MD  DULoxetine (CYMBALTA) 60 MG capsule Take 60 mg by mouth 2 (two) times daily.  02/15/19 03/06/20  [provider]  esomeprazole (NEXIUM) 40 MG capsule Take 40 mg by mouth daily.  01/17/19   [provider]  fluticasone (FLOVENT HFA) 110 MCG/ACT inhaler Inhale 2 puffs into the lungs 2 (two) times daily.  05/14/19  [provider]  linaclotide (LINZESS) 72 MCG capsule Take 72 mcg by mouth daily before breakfast.    [provider]  QUEtiapine (SEROQUEL) 200 MG tablet Take 600 mg by mouth at bedtime.    [provider]  SUMAtriptan (IMITREX) 50 MG tablet Take 50 mg by mouth every 2 (two) hours as needed for migraine or headache.  02/26/19   [provider]    Physical Exam: Vitals:   10/19/19 2001 10/19/19 2143 10/19/19 2200 10/19/19 2326   BP:  (!) 155/109 (!) 144/87 (!) 142/89  Pulse:    99  Resp:    (!) 22  Temp: 97.6 F (36.4 C)     TempSrc: Oral     SpO2:    95%  Weight:          Constitutional: Very anxious, acutely ill looking, tremulous, mildly agitated Vitals:   10/19/19 2001 10/19/19 2143 10/19/19 2200 10/19/19 2326  BP:  (!) 155/109 (!) 144/87 (!) 142/89  Pulse:    99  Resp:    (!) 22  Temp: 97.6 F (36.4 C)     TempSrc: Oral     SpO2:    95%  Weight:       Eyes: PERRL, lids and conjunctivae normal ENMT: Mucous membranes are dry. Posterior pharynx clear of any exudate or lesions.Normal dentition.  Neck: normal, supple, no masses, no thyromegaly Respiratory: clear to auscultation bilaterally, no wheezing, no crackles. Normal respiratory effort. No accessory muscle use.  Cardiovascular: Sinus tachycardia no murmurs / rubs / gallops. No extremity edema. 2+ pedal pulses. No carotid bruits.  Abdomen: no tenderness, no masses palpated. No hepatosplenomegaly. Bowel sounds positive.  Musculoskeletal: no clubbing / cyanosis. No joint deformity upper and lower extremities. Good ROM, no contractures. Normal muscle tone.  Skin: no rashes, lesions, ulcers. No induration Neurologic: CN 2-12 grossly intact. Sensation intact, DTR normal. Strength 5/5 in all 4.  No visible focal deficit except for confusion Psychiatric: Very anxious mood and slightly agitated.  Tremulous and moving around in bed alert.  Pressured speech    Labs on Admission: I have personally reviewed following labs and imaging studies  CBC: Recent Labs  Lab 10/19/19 1358  WBC 14.6*  HGB 13.8  HCT 45.1  MCV 102.5*  PLT 192   Basic Metabolic Panel: Recent Labs  Lab 10/19/19 1358  NA 141  K 4.6  CL 102  CO2 24  GLUCOSE 98  BUN 33*  CREATININE 1.37*  CALCIUM 9.0   GFR: Estimated Creatinine Clearance: 38 mL/min (A) (by C-G formula based on SCr of 1.37 mg/dL (H)). Liver Function Tests: Recent Labs  Lab 10/19/19 1358  AST 70*   ALT 49*  ALKPHOS 75  BILITOT 0.4  PROT 7.0  ALBUMIN 4.2   No results for input(s): LIPASE, AMYLASE in the last 168 hours. No results for input(s): AMMONIA in the last 168 hours. Coagulation Profile: No results for input(s): INR, PROTIME in the last 168 hours. Cardiac Enzymes: No results for input(s): CKTOTAL, CKMB, CKMBINDEX, TROPONINI in the last 168 hours. BNP (last 3 results) No results for input(s): PROBNP in the last 8760 hours. HbA1C: No results for input(s): HGBA1C in the last 72 hours. CBG: Recent Labs  Lab 10/19/19 1503  GLUCAP 85   Lipid Profile: No results for input(s): CHOL, HDL, LDLCALC, TRIG, CHOLHDL, LDLDIRECT in the last 72 hours. Thyroid Function Tests: No results for input(s): TSH, T4TOTAL, FREET4, T3FREE, THYROIDAB in the last 72 hours. Anemia Panel: No  results for input(s): VITAMINB12, FOLATE, FERRITIN, TIBC, IRON, RETICCTPCT in the last 72 hours. Urine analysis:    Component Value Date/Time   COLORURINE YELLOW 10/19/2019 2009   APPEARANCEUR CLEAR 10/19/2019 2009   LABSPEC 1.013 10/19/2019 2009   PHURINE 5.0 10/19/2019 2009   GLUCOSEU NEGATIVE 10/19/2019 2009   HGBUR SMALL (A) 10/19/2019 2009   BILIRUBINUR NEGATIVE 10/19/2019 2009   KETONESUR 5 (A) 10/19/2019 2009   PROTEINUR NEGATIVE 10/19/2019 2009   NITRITE NEGATIVE 10/19/2019 2009   LEUKOCYTESUR NEGATIVE 10/19/2019 2009   Sepsis Labs: @LABRCNTIP (procalcitonin:4,lacticidven:4) ) Recent Results (from the past 240 hour(s))  SARS Coronavirus 2 by RT PCR (hospital order, performed in [facility] hospital lab) Nasopharyngeal Nasopharyngeal Swab     Status: None   Collection Time: 10/19/19  4:02 PM   Specimen: Nasopharyngeal Swab  Result Value Ref Range Status   SARS Coronavirus 2 NEGATIVE NEGATIVE Final    Comment: (NOTE) SARS-CoV-2 target nucleic acids are NOT DETECTED.  The SARS-CoV-2 RNA is generally detectable in upper and lower respiratory specimens during the acute phase of infection.  The lowest concentration of SARS-CoV-2 viral copies this assay can detect is 250 copies / mL. A negative result does not preclude SARS-CoV-2 infection and should not be used as the sole basis for treatment or other patient management decisions.  A negative result may occur with improper specimen collection / handling, submission of specimen other than nasopharyngeal swab, presence of viral mutation(s) within the areas targeted by this assay, and inadequate number of viral copies (<250 copies / mL). A negative result must be combined with clinical observations, patient history, and epidemiological information.  Fact Sheet for Patients:   10/21/19  Fact Sheet for Healthcare Providers: BoilerBrush.com.cy  This test is not yet approved or  cleared by the https://pope.com/ FDA and has been authorized for detection and/or diagnosis of SARS-CoV-2 by FDA under an Emergency Use Authorization (EUA).  This EUA will remain in effect (meaning this test can be used) for the duration of the COVID-19 declaration under Section 564(b)(1) of the Act, 21 U.S.C. section 360bbb-3(b)(1), unless the authorization is terminated or revoked sooner.  Performed at [facility], [address] [place], [place]      Radiological Exams on Admission: DG Pelvis 1-2 Views  Result Date: 10/19/2019 CLINICAL DATA:  Metal screening prior to MRI EXAM: PELVIS - 1-2 VIEW COMPARISON:  None. FINDINGS: No metal foreign body projects over the pelvis. Upper normal amount of stool in the colon. No significant abnormal calcifications. No significant bony abnormality is observed. IMPRESSION: 1. No metal foreign body projects over the pelvis. 2. Upper normal amount of stool in the colon. Electronically Signed   By: 10/21/2019 M.D.   On: 10/19/2019 18:29   DG Shoulder Right  Result Date: 10/19/2019 CLINICAL DATA:  pain EXAM: RIGHT SHOULDER - 2+ VIEW  COMPARISON:  None. FINDINGS: There is no evidence of fracture or dislocation. Mild AC joint degenerative changes. Soft tissues are unremarkable. IMPRESSION: No acute finding in the right shoulder. Electronically Signed   By: 10/21/2019 M.D.   On: 10/19/2019 16:23   DG Abd 1 View  Result Date: 10/19/2019 CLINICAL DATA:  59 year old female with pre MRI radiograph. Evaluate for metallic foreign object. EXAM: ABDOMEN - 1 VIEW COMPARISON:  None. FINDINGS: No metallic foreign object noted in the abdomen. There is moderate stool throughout the colon. No bowel dilatation or evidence of obstruction. Linear radiopaque foci over the left renal silhouette may  represent kidney stone. The osseous structures and soft tissues are unremarkable. IMPRESSION: No metallic foreign object in the abdomen. Electronically Signed   By: Elgie Collard M.D.   On: 10/19/2019 18:29   CT Head Wo Contrast  Result Date: 10/19/2019 CLINICAL DATA:  Pain following trauma.  Altered mental status EXAM: CT HEAD WITHOUT CONTRAST CT CERVICAL SPINE WITHOUT CONTRAST TECHNIQUE: Multidetector CT imaging of the head and cervical spine was performed following the standard protocol without intravenous contrast. Multiplanar CT image reconstructions of the cervical spine were also generated. COMPARISON:  None. FINDINGS: CT HEAD FINDINGS Brain: The ventricles and sulci are normal in size and configuration. There is no appreciable intracranial mass, hemorrhage, extra-axial fluid collection, or midline shift. There is symmetric decreased attenuation in the posterior mid cerebellar hemispheres bilaterally. There is subtle edema in the posterolateral aspect of the left parietal lobe. Concern for potential multifocal infarct given this appearance. Elsewhere brain parenchyma appears unremarkable. Vascular: No appreciable hyperdense vessel. There is calcification in the left carotid siphon. Skull: The bony calvarium appears intact. Sinuses/Orbits: There is  diffuse opacification of the sphenoid sinus regions. There is mild mucosal thickening in several ethmoid air cells. No intraorbital lesions evident. Other: Mastoid air cells are clear. CT CERVICAL SPINE FINDINGS Alignment: There is no appreciable spondylolisthesis. Skull base and vertebrae: Skull base and craniocervical junction regions appear normal. No appreciable fracture. No blastic or lytic bone lesions. Soft tissues and spinal canal: The prevertebral soft tissues and predental space regions are normal. There is no evident cord or canal hematoma. No paraspinous lesions are evident. Disc levels: There is moderate disc space narrowing at C4-5 and C5-6 with moderate narrowing of the anterior aspect of the C3-4 disc. There are anterior osteophytes at C3, C4, C5, and C6. There is facet hypertrophy at multiple levels. There is mild exit foraminal narrowing at C4-5 on the left with impression on the exiting nerve root. There is milder impression on the exiting nerve roots at C5-6 bilaterally. There is no frank disc extrusion or stenosis. Upper chest: There is evidence of centrilobular emphysematous change in the upper lobes. No upper lung region edema or consolidation. Other: There are foci of carotid artery calcification bilaterally. IMPRESSION: CT head: Symmetric decreased attenuation in each posterior mid cerebellar hemisphere. Suspect edema in the superolateral aspect of the left parietal lobe. Concern for multifocal infarcts. MR correlation advised. No mass or hemorrhage. Calcification noted in the left carotid siphon region. Paranasal sinus disease noted with diffuse opacification of the sphenoid sinus region which may well be chronic given the overall appearance. CT cervical spine: No fracture or spondylolisthesis. Multilevel osteoarthritic change. No disc extrusion or stenosis. Upper lobe emphysematous change noted. Foci of carotid artery calcification noted bilaterally. Electronically Signed   By: Bretta Bang III M.D.   On: 10/19/2019 14:39   CT Cervical Spine Wo Contrast  Result Date: 10/19/2019 CLINICAL DATA:  Pain following trauma.  Altered mental status EXAM: CT HEAD WITHOUT CONTRAST CT CERVICAL SPINE WITHOUT CONTRAST TECHNIQUE: Multidetector CT imaging of the head and cervical spine was performed following the standard protocol without intravenous contrast. Multiplanar CT image reconstructions of the cervical spine were also generated. COMPARISON:  None. FINDINGS: CT HEAD FINDINGS Brain: The ventricles and sulci are normal in size and configuration. There is no appreciable intracranial mass, hemorrhage, extra-axial fluid collection, or midline shift. There is symmetric decreased attenuation in the posterior mid cerebellar hemispheres bilaterally. There is subtle edema in the posterolateral aspect of the left parietal  lobe. Concern for potential multifocal infarct given this appearance. Elsewhere brain parenchyma appears unremarkable. Vascular: No appreciable hyperdense vessel. There is calcification in the left carotid siphon. Skull: The bony calvarium appears intact. Sinuses/Orbits: There is diffuse opacification of the sphenoid sinus regions. There is mild mucosal thickening in several ethmoid air cells. No intraorbital lesions evident. Other: Mastoid air cells are clear. CT CERVICAL SPINE FINDINGS Alignment: There is no appreciable spondylolisthesis. Skull base and vertebrae: Skull base and craniocervical junction regions appear normal. No appreciable fracture. No blastic or lytic bone lesions. Soft tissues and spinal canal: The prevertebral soft tissues and predental space regions are normal. There is no evident cord or canal hematoma. No paraspinous lesions are evident. Disc levels: There is moderate disc space narrowing at C4-5 and C5-6 with moderate narrowing of the anterior aspect of the C3-4 disc. There are anterior osteophytes at C3, C4, C5, and C6. There is facet hypertrophy at multiple  levels. There is mild exit foraminal narrowing at C4-5 on the left with impression on the exiting nerve root. There is milder impression on the exiting nerve roots at C5-6 bilaterally. There is no frank disc extrusion or stenosis. Upper chest: There is evidence of centrilobular emphysematous change in the upper lobes. No upper lung region edema or consolidation. Other: There are foci of carotid artery calcification bilaterally. IMPRESSION: CT head: Symmetric decreased attenuation in each posterior mid cerebellar hemisphere. Suspect edema in the superolateral aspect of the left parietal lobe. Concern for multifocal infarcts. MR correlation advised. No mass or hemorrhage. Calcification noted in the left carotid siphon region. Paranasal sinus disease noted with diffuse opacification of the sphenoid sinus region which may well be chronic given the overall appearance. CT cervical spine: No fracture or spondylolisthesis. Multilevel osteoarthritic change. No disc extrusion or stenosis. Upper lobe emphysematous change noted. Foci of carotid artery calcification noted bilaterally. Electronically Signed   By: Bretta BangWilliam  Woodruff III M.D.   On: 10/19/2019 14:39   MR BRAIN WO CONTRAST  Addendum Date: 10/19/2019   ADDENDUM REPORT: 10/19/2019 21:11 ADDENDUM: These results were called by telephone at the time of interpretation on 10/19/2019 at 8:56 Pm to provider Dr. Fredderick PhenixBelfi, who verbally acknowledged these results. Electronically Signed   By: Stana Buntinghikanele  Emekauwa M.D.   On: 10/19/2019 21:11   Result Date: 10/19/2019 CLINICAL DATA:  Mental status change. EXAM: MRI HEAD WITHOUT CONTRAST TECHNIQUE: Multiplanar, multiecho pulse sequences of the brain and surrounding structures were obtained without intravenous contrast. COMPARISON:  10/19/2019 head CT. FINDINGS: Brain: Multifocal acute infarcts involving the left parietal lobe, left centrum semiovale, bilateral globi pallidi, left hippocampus, right occipital lobe and bilateral  cerebellar hemispheres. Tiny foci of SWI signal dropout within the left parietal region may reflect petechial hemorrhages. Background scattered punctate T2 hyperintensities involving the supratentorial white matter likely reflect chronic microvascular ischemic changes. No midline shift, ventriculomegaly or extra-axial fluid collection. No mass lesion. Vascular: Normal flow voids. Skull and upper cervical spine: Normal marrow signal. Sinuses/Orbits: Normal orbits. Sphenoid sinus disease. No mastoid effusion. Other: None. 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 Minimal chronic microvascular ischemic changes. Electronically Signed: By: Stana Buntinghikanele  Emekauwa M.D. On: 10/19/2019 20:52   DG Chest Portable 1 View  Result Date: 10/19/2019 CLINICAL DATA:  COPD, confused EXAM: PORTABLE CHEST 1 VIEW COMPARISON:  None. FINDINGS: Chronic interstitial prominence. No new consolidation or edema. No pleural effusion. Stable cardiomediastinal contours. Possible enlargement of  the main pulmonary artery. Normal heart size. IMPRESSION: No acute abnormality in the chest. Electronically Signed   By: Guadlupe Spanish M.D.   On: 10/19/2019 13:51    EKG: Independently reviewed.  It shows normal sinus rhythm with a rate of 86.  Evidence of LVH by voltage criteria.  Peak T waves minor ST elevation in the lateral leads.  Assessment/Plan Principal Problem:   CVA (cerebral vascular accident) (HCC) Active Problems:   Cocaine abuse (HCC)   Benzodiazepine abuse (HCC)   Anxiety   Polysubstance abuse (HCC)     #1 multifocal diffuse CVA: Probably cocaine related.  Worrisome for shower emboli probably related to some type of endocarditis.  No fever but does have white count.  Patient will be admitted and neurology will follow.  Aspirin and statin.  Echocardiogram and carotid  Dopplers.  Patient may require TEE to rule out shower emboli.  In the meantime monitor on telemetric.  #2 polysubstance abuse: She is positive for benzos and cocaine.  Has been on benzos treatment wise.  Patient also apparently a smoker.  May have used other substances not measured on the drug screen.  She has a Comptroller in the room now but is getting count.  We may DC the sitter and continue counseling.  I will start Ativan to avoid withdrawals and this will be as needed every 6 hours.  #3 anxiety disorder: Continue home regimen.  #4 hypertension: We will resume home regimen when stable enough.  #5 COPD: No exacerbation.  Continue monitor   DVT prophylaxis: Lovenox Code Status: Full code Family Communication: No family at bedside with daughter available over the phone Disposition Plan: Home Consults called: Dr. Otelia Limes, neurology Admission status: Inpatient  Severity of Illness: The appropriate patient status for this patient is INPATIENT. Inpatient status is judged to be reasonable and necessary in order to provide the required intensity of service to ensure the patient's safety. The patient's presenting symptoms, physical exam findings, and initial radiographic and laboratory data in the context of their chronic comorbidities is felt to place them at high risk for further clinical deterioration. Furthermore, it is not anticipated that the patient will be medically stable for discharge from the hospital within 2 midnights of admission. The following factors support the patient status of inpatient.   " The patient's presenting symptoms include confusion. " The worrisome physical exam findings include confusion but no focal weakness. " The initial radiographic and laboratory data are worrisome because of MRI showing multifocal CVA. " The chronic co-morbidities include polysubstance abuse.   * I certify that at the point of admission it is my clinical judgment that the patient will require  inpatient hospital care spanning beyond 2 midnights from the point of admission due to high intensity of service, high risk for further deterioration and high frequency of surveillance required.Lonia Blood MD [facility] Pager [phone]  If 7PM-7AM, please contact night-coverage [email or web address] Password [facility]  10/19/2019, 11:51 PM

## 2019-10-20 ENCOUNTER — Inpatient Hospital Stay (HOSPITAL_COMMUNITY): Payer: Medicaid Other

## 2019-10-20 DIAGNOSIS — R569 Unspecified convulsions: Secondary | ICD-10-CM

## 2019-10-20 DIAGNOSIS — R4182 Altered mental status, unspecified: Secondary | ICD-10-CM

## 2019-10-20 DIAGNOSIS — I6389 Other cerebral infarction: Secondary | ICD-10-CM | POA: Diagnosis not present

## 2019-10-20 LAB — LIPID PANEL
Cholesterol: 218 mg/dL — ABNORMAL HIGH (ref 0–200)
HDL: 71 mg/dL (ref >40)
LDL Cholesterol: 132 mg/dL — ABNORMAL HIGH (ref 0–99)
Total CHOL/HDL Ratio: 3.1 RATIO
Triglycerides: 77 mg/dL (ref <150)
VLDL: 15 mg/dL (ref 0–40)

## 2019-10-20 LAB — ECHOCARDIOGRAM COMPLETE
Area-P 1/2: 3.37 cm2
Height: 65 in
S' Lateral: 2.8 cm
Weight: 1920 oz

## 2019-10-20 LAB — HEMOGLOBIN A1C
Hgb A1c MFr Bld: 6.3 % — ABNORMAL HIGH (ref 4.8–5.6)
Mean Plasma Glucose: 134.11 mg/dL

## 2019-10-20 LAB — CREATININE, SERUM
Creatinine, Ser: 0.83 mg/dL (ref 0.44–1.00)
GFR calc Af Amer: >60 mL/min (ref >60)
GFR calc non Af Amer: >60 mL/min (ref >60)

## 2019-10-20 LAB — HIV ANTIBODY (ROUTINE TESTING W REFLEX): HIV Screen 4th Generation wRfx: NONREACTIVE

## 2019-10-20 MED ORDER — CLOPIDOGREL BISULFATE 75 MG PO TABS
75.0000 mg | ORAL_TABLET | Freq: Every day | ORAL | Status: DC
  Administered 2019-10-20 – 2019-10-23 (×4): 75 mg via ORAL
  Filled 2019-10-20 (×4): qty 1

## 2019-10-20 MED ORDER — NICOTINE 21 MG/24HR TD PT24
21.0000 mg | MEDICATED_PATCH | Freq: Every day | TRANSDERMAL | Status: DC
  Administered 2019-10-20: 21 mg via TRANSDERMAL
  Filled 2019-10-20 (×3): qty 1

## 2019-10-20 MED ORDER — HYDROCODONE-ACETAMINOPHEN 5-325 MG PO TABS
1.0000 | ORAL_TABLET | ORAL | Status: AC | PRN
  Administered 2019-10-20 (×2): 2 via ORAL
  Filled 2019-10-20 (×2): qty 2

## 2019-10-20 MED ORDER — LORAZEPAM 2 MG/ML IJ SOLN
INTRAMUSCULAR | Status: AC
  Filled 2019-10-20: qty 1

## 2019-10-20 MED ORDER — LORAZEPAM 2 MG/ML IJ SOLN
1.0000 mg | Freq: Once | INTRAMUSCULAR | Status: AC
  Administered 2019-10-20: 1 mg via INTRAVENOUS
  Filled 2019-10-20: qty 1

## 2019-10-20 MED ORDER — LORAZEPAM 2 MG/ML IJ SOLN: 0.5000 mg | INTRAMUSCULAR | Status: DC | PRN

## 2019-10-20 MED ORDER — PROMETHAZINE HCL 25 MG/ML IJ SOLN
12.5000 mg | Freq: Four times a day (QID) | INTRAMUSCULAR | Status: DC | PRN
  Administered 2019-10-20 – 2019-10-22 (×5): 12.5 mg via INTRAVENOUS
  Filled 2019-10-20 (×5): qty 1

## 2019-10-20 MED ORDER — LORAZEPAM 2 MG/ML IJ SOLN
2.0000 mg | Freq: Once | INTRAMUSCULAR | Status: AC
  Administered 2019-10-20: 2 mg via INTRAVENOUS

## 2019-10-20 MED ORDER — LORAZEPAM 2 MG/ML IJ SOLN
0.5000 mg | INTRAMUSCULAR | Status: DC | PRN
  Administered 2019-10-20 (×2): 1 mg via INTRAVENOUS
  Filled 2019-10-20 (×2): qty 1

## 2019-10-20 MED ORDER — METOCLOPRAMIDE HCL 5 MG/ML IJ SOLN
10.0000 mg | Freq: Four times a day (QID) | INTRAMUSCULAR | Status: DC
  Administered 2019-10-20 – 2019-10-23 (×12): 10 mg via INTRAVENOUS
  Filled 2019-10-20 (×12): qty 2

## 2019-10-20 MED ORDER — METOCLOPRAMIDE HCL 5 MG/ML IJ SOLN
5.0000 mg | Freq: Four times a day (QID) | INTRAMUSCULAR | Status: DC
  Administered 2019-10-20: 5 mg via INTRAVENOUS
  Filled 2019-10-20: qty 2

## 2019-10-20 MED ORDER — IOHEXOL 350 MG/ML SOLN
50.0000 mL | Freq: Once | INTRAVENOUS | Status: AC | PRN
  Administered 2019-10-20: 50 mL via INTRAVENOUS

## 2019-10-20 MED ORDER — MORPHINE SULFATE (PF) 2 MG/ML IV SOLN
2.0000 mg | INTRAVENOUS | Status: DC | PRN
  Administered 2019-10-20 – 2019-10-22 (×2): 2 mg via INTRAVENOUS
  Filled 2019-10-20 (×3): qty 1

## 2019-10-20 MED ORDER — ASPIRIN EC 81 MG PO TBEC
81.0000 mg | DELAYED_RELEASE_TABLET | Freq: Every day | ORAL | Status: DC
  Administered 2019-10-20 – 2019-10-23 (×4): 81 mg via ORAL
  Filled 2019-10-20 (×4): qty 1

## 2019-10-20 NOTE — ED Notes (Signed)
Pt currently comfortable, reports decreased anxiety and nausea. Vital signs stable.

## 2019-10-20 NOTE — ED Notes (Signed)
Pt unable to tolerate PO meds at this time. Pt vomited immediately following administration of PO Norco and atorvastatin. MD notified, orders placed.

## 2019-10-20 NOTE — ED Notes (Signed)
Pt reports being able to keep down a few bites of Jello.

## 2019-10-20 NOTE — ED Notes (Addendum)
Pt unable to rest due to anxiety, reported migraine, and n/v. Small amount of emesis produced. Pt reports nausea unrelieved by previous dose of 1 mg PO ativan. MD notified. Order placed for one time dose of IV ativan.

## 2019-10-20 NOTE — Progress Notes (Signed)
Spoke with RN pt is a little restless and not very cooperative at the moment will try back at noon to see if we can attempt EEG

## 2019-10-20 NOTE — ED Notes (Signed)
Attempted report x 2 

## 2019-10-20 NOTE — Procedures (Signed)
Patient Name: Danielle Wong  MRN: [id number]  Epilepsy Attending: Charlsie Quest  Referring Physician/Provider: Dr Delia Heady Date: 10/20/2019 Duration: 23.41 mins  Patient history: 59 y.o. female who recently moved here from [place] with history Xanax abuse, (cocaine on drug screen) and COPD with ongoing tobacco use, was found sitting in a chair confused. EEG to evaluate for seizures.  Level of alertness: Awake  AEDs during EEG study: Lorazepam  Technical aspects: This EEG study was done with scalp electrodes positioned according to the 10-20 International system of electrode placement. Electrical activity was acquired at a sampling rate of 500Hz  and reviewed with a high frequency filter of 70Hz  and a low frequency filter of 1Hz . EEG data were recorded continuously and digitally stored.   Description: No posterior dominant rhythm was seen. EEG showed continuous generalized polymorphic 3 to 6 Hz theta-delta slowing.  Hyperventilation and photic stimulation were not performed.     ABNORMALITY -Continuous slow, generalized  IMPRESSION: This study is suggestive of moderate diffuse encephalopathy, nonspecific etiology. No seizures or epileptiform discharges were seen throughout the recording.  [name] 

## 2019-10-20 NOTE — Progress Notes (Signed)
[facility] Progress Note  Patient: Danielle Wong    ZOX:[id number]  DOA: 10/19/2019     Date of Service: the patient was seen and examined on 10/20/2019  Brief hospital course: Past medical history of COPD, HTN, anxiety disorder, migraine, substance abuse.  Presents with confusion and intractable nausea and vomiting.  Found to have acute CVA likely from cocaine. Currently plan is for the stroke work-up.  Assessment and Plan: 1.  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 MRI brain confirmed the diagnosis. CTA head and neck negative for any acute large vessel occlusion. LDL pending.  Hemoglobin A1c pending.  UDS positive for cocaine and benzo.  EEG shows encephalopathy. Echocardiogram currently pending. Will follow neurology recommendation regarding further stroke work-up.  2.  Intractable nausea and vomiting Etiology not clear. CTA head and neck negative for any evidence of mass-effect or elevated intracranial pressure. No new focal deficit. X-ray abdomen negative for any obstruction. Continue scheduled Reglan.  Liquid diet.  3.  Substance abuse/cocaine positive Avoid beta-blockers. Monitor.  4.  Essential hypertension Blood pressure stable.  Monitor.  Permissive hypertension. Holding Norvasc  5.  Hyperlipidemia LDL pending.  Lipitor 40 mg daily.  6.  Active smoker Encourage the patient to quit smoking.  Currently not ready.  7.  Anxiety Ativan as needed.  8.  AKI. Not CKD. In the setting of intractable nausea and vomiting. Continue with IV fluid.  9.  Leukocytosis. Stress reaction. Monitor.  10 macrocytosis. Check B12 and folic acid  Diet: Liquid diet DVT Prophylaxis:   enoxaparin (LOVENOX) injection 30 mg Start: 10/20/19 1000    Advance goals of care discussion: Full code  Family Communication: no family was present at bedside, at the time of interview.    Disposition:  Status is: Inpatient  Remains inpatient appropriate because:Ongoing diagnostic testing needed not appropriate for outpatient work up   Dispo: The patient is from: Home              Anticipated d/c is to: Home              Anticipated d/c date is: 2 days              Patient currently is not medically stable to d/c.  Subjective: Reports anxiety.  Persistent nausea with frequent vomiting.  No abdominal pain.  Passing gas.  Had a bowel movement.  Physical Exam:  General: Appear in mild distress, no Rash; Oral Mucosa Clear, moist. no Abnormal Neck Mass Or lumps, Conjunctiva normal  Cardiovascular: S1 and S2 Present, no Murmur, Respiratory: good respiratory effort, Bilateral Air entry present and Clear to Auscultation, no Crackles, no wheezes Abdomen: Bowel Sound present, Soft and no tenderness Extremities: no Pedal edema, no calf tenderness Neurology: alert and oriented to time, place, and person affect appropriate. no new focal deficit Decreased attention and recall.  Normal speech Mild right-sided weakness.  Pupils are reactive. Gait not checked due to patient safety concerns  Vitals:   10/20/19 1514 10/20/19 1729 10/20/19 1801 10/20/19 1830  BP:  (!) 133/108 (!) 142/94 (!) 145/91  Pulse: 88 86 92 95  Resp: 19 20 15 17   Temp:      TempSrc:      SpO2: 98% 93% 96% 93%  Weight:      Height:        Intake/Output Summary (Last 24 hours) at 10/20/2019 1900 Last data filed at 10/20/2019 0531 Gross per 24 hour  Intake --  Output 400 ml  Net -400 ml   Filed Weights   10/19/19 1248 10/20/19 0646  Weight: 54.4 kg 54.4 kg    Data Reviewed: I have personally reviewed and interpreted daily labs, tele strips, imagings as discussed above. I reviewed all nursing notes, pharmacy notes, vitals, pertinent old records I have discussed plan of care as described above with RN and patient/family.  CBC: Recent Labs  Lab 10/19/19 1358  WBC 14.6*  HGB 13.8  HCT 45.1   MCV 102.5*  PLT 192   Basic Metabolic Panel: Recent Labs  Lab 10/19/19 1358 10/20/19 1049  NA 141  --   K 4.6  --   CL 102  --   CO2 24  --   GLUCOSE 98  --   BUN 33*  --   CREATININE 1.37* 0.83  CALCIUM 9.0  --     Studies: MR BRAIN WO CONTRAST  Addendum Date: 10/19/2019   ADDENDUM REPORT: 10/19/2019 21:11 ADDENDUM: These results were called by telephone at the time of interpretation on 10/19/2019 at 8:56 Pm to provider Dr. Fredderick Phenix, who verbally acknowledged these results. Electronically Signed   By: Stana Bunting M.D.   On: 10/19/2019 21:11   Result Date: 10/19/2019 CLINICAL DATA:  Mental status change. EXAM: MRI HEAD WITHOUT CONTRAST TECHNIQUE: Multiplanar, multiecho pulse sequences of the brain and surrounding structures were obtained without intravenous contrast. COMPARISON:  10/19/2019 head CT. 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 Tiny foci of SWI signal dropout within the left parietal region may reflect petechial hemorrhages. Background scattered punctate T2 hyperintensities involving the supratentorial white matter likely reflect chronic microvascular ischemic changes. No midline shift, ventriculomegaly or extra-axial fluid collection. No mass lesion. Vascular: Normal flow voids. Skull and upper cervical spine: Normal marrow signal. Sinuses/Orbits: Normal orbits. Sphenoid sinus disease. No mastoid effusion. Other: None. 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 Minimal chronic microvascular ischemic changes. Electronically Signed: By: Stana Bunting M.D. On: 10/19/2019 20:52   DG Abd Portable 1V  Result Date: 10/20/2019 CLINICAL DATA:  Nausea and  vomiting. EXAM: PORTABLE ABDOMEN - 1 VIEW COMPARISON:  Abdominal radiograph dated 10/19/2019 FINDINGS: The bowel gas pattern is normal. No radio-opaque calculi or other significant radiographic abnormality are seen. Contrast is seen within the urinary collecting system and urinary bladder. IMPRESSION: Nonobstructive bowel gas pattern. Electronically Signed   By: Romona Curls M.D.   On: 10/20/2019 13:16   EEG adult  Result Date: 10/20/2019 Charlsie Quest, MD     10/20/2019  6:04 PM Patient Name: Danielle Wong MRN: [id number] Epilepsy Attending: Charlsie Quest Referring Physician/Provider: Dr Delia Heady Date: 10/20/2019 Duration: 23.41 mins Patient history: 59 y.o. female who recently moved here from [place] with history Xanax abuse, (cocaine on drug screen) and COPD with ongoing tobacco use, was found sitting in a chair confused. EEG to evaluate for seizures. Level of alertness: Awake AEDs during EEG study: Lorazepam Technical aspects: This EEG study was done with scalp electrodes positioned according to the 10-20 International system of electrode placement. Electrical activity was acquired at a sampling rate of 500Hz  and reviewed with a high frequency filter of 70Hz  and a low frequency filter of 1Hz . EEG data were recorded continuously and digitally stored. Description: No posterior dominant rhythm was seen.  EEG showed continuous generalized polymorphic 3 to 6 Hz theta-delta slowing.  Hyperventilation and photic stimulation were not performed.   ABNORMALITY -Continuous slow, generalized IMPRESSION: This study is suggestive of moderate diffuse encephalopathy, nonspecific etiology. No seizures or epileptiform discharges were seen throughout the recording. Charlsie Quest   ECHOCARDIOGRAM COMPLETE  Result Date: 10/20/2019    ECHOCARDIOGRAM REPORT   Patient Name:   Danielle Wong Date of Exam: 10/20/2019 Medical Rec #:  [id number]    Height:       65.0 in Accession #:    [id number]   Weight:       120.0 lb Date of  Birth:  04/16/1960     BSA:          1.592 m Patient Age:    59 years     BP:           145/88 mmHg Patient Gender: F            HR:           83 bpm. Exam Location:  Inpatient Procedure: 2D Echo Indications:    stroke 434.91  History:        Patient has no prior history of Echocardiogram examinations.                 COPD; Risk Factors:Hypertension. Polysubstance abuse.  Sonographer:    Celene Skeen RDCS (AE) Referring Phys: [id number] [facility] Jerelyn Charles  Sonographer Comments: patient extremely figety, which made imaging challenging at times IMPRESSIONS  1. Left ventricular ejection fraction, by estimation, is 60 to 65%. The left ventricle has normal function. The left ventricle has no regional wall motion abnormalities. Left ventricular diastolic parameters were normal.  2. Right ventricular systolic function is normal. The right ventricular size is mildly enlarged. Tricuspid regurgitation signal is inadequate for assessing PA pressure.  3. The mitral valve is grossly normal. No evidence of mitral valve regurgitation. No evidence of mitral stenosis.  4. The aortic valve is tricuspid. Aortic valve regurgitation is not visualized. Mild aortic valve sclerosis is present, with no evidence of aortic valve stenosis.  5. The inferior vena cava is dilated in size with <50% respiratory variability, suggesting right atrial pressure of 15 mmHg. Conclusion(s)/Recommendation(s): No intracardiac source of embolism detected on this transthoracic study. A transesophageal echocardiogram is recommended to exclude cardiac source of embolism if clinically indicated. FINDINGS  Left Ventricle: Left ventricular ejection fraction, by estimation, is 60 to 65%. The left ventricle has normal function. The left ventricle has no regional wall motion abnormalities. The left ventricular internal cavity size was normal in size. There is  no left ventricular hypertrophy. Left ventricular diastolic parameters were normal. Right Ventricle: The right  ventricular size is mildly enlarged. No increase in right ventricular wall thickness. Right ventricular systolic function is normal. Tricuspid regurgitation signal is inadequate for assessing PA pressure. Left Atrium: Left atrial size was normal in size. Right Atrium: Right atrial size was normal in size. Pericardium: Trivial pericardial effusion is present. The pericardial effusion is circumferential. Mitral Valve: The mitral valve is grossly normal. No evidence of mitral valve regurgitation. No evidence of mitral valve stenosis. Tricuspid Valve: The tricuspid valve is grossly normal. Tricuspid valve regurgitation is trivial. No evidence of tricuspid stenosis. Aortic Valve: The aortic valve is tricuspid. . There is mild thickening and mild calcification of the aortic valve. Aortic valve regurgitation is not visualized. Mild aortic valve sclerosis is present, with no evidence of aortic valve stenosis. Mild aortic valve  annular calcification. There is mild thickening of the aortic valve. There is mild calcification of the aortic valve. Pulmonic Valve: The pulmonic valve was grossly normal. Pulmonic valve regurgitation is not visualized. No evidence of pulmonic stenosis. Aorta: The aortic root and ascending aorta are structurally normal, with no evidence of dilitation. Venous: The inferior vena cava is dilated in size with less than 50% respiratory variability, suggesting right atrial pressure of 15 mmHg. IAS/Shunts: The atrial septum is grossly normal.  LEFT VENTRICLE PLAX 2D LVIDd:         4.20 cm  Diastology LVIDs:         2.80 cm  LV e' lateral:   8.59 cm/s LV PW:         1.10 cm  LV E/e' lateral: 9.4 LV IVS:        1.00 cm  LV e' medial:    8.16 cm/s LVOT diam:     2.10 cm  LV E/e' medial:  9.9 LV SV:         68 LV SV Index:   43 LVOT Area:     3.46 cm  RIGHT VENTRICLE RV S prime:     9.68 cm/s TAPSE (M-mode): 1.7 cm LEFT ATRIUM             Index       RIGHT ATRIUM           Index LA diam:        3.20 cm 2.01  cm/m  RA Area:     10.70 cm LA Vol (A2C):   29.4 ml 18.47 ml/m RA Volume:   20.40 ml  12.81 ml/m LA Vol (A4C):   19.7 ml 12.37 ml/m LA Biplane Vol: 25.0 ml 15.70 ml/m  AORTIC VALVE LVOT Vmax:   94.60 cm/s LVOT Vmean:  67.700 cm/s LVOT VTI:    0.197 m  AORTA Ao Root diam: 3.40 cm MITRAL VALVE MV Area (PHT): 3.37 cm    SHUNTS MV Decel Time: 225 msec    Systemic VTI:  0.20 m MV E velocity: 81.00 cm/s  Systemic Diam: 2.10 cm MV A velocity: 51.40 cm/s MV E/A ratio:  1.58 Lennie Odor MD Electronically signed by Lennie Odor MD Signature Date/Time: 10/20/2019/12:56:26 PM    Final    CT ANGIO HEAD CODE STROKE  Result Date: 10/20/2019 CLINICAL DATA:  Stroke, follow-up EXAM: CT ANGIOGRAPHY HEAD AND NECK TECHNIQUE: Multidetector CT imaging of the head and neck was performed using the standard protocol during bolus administration of intravenous contrast. Multiplanar CT image reconstructions and MIPs were obtained to evaluate the vascular anatomy. Carotid stenosis measurements (when applicable) are obtained utilizing NASCET criteria, using the distal internal carotid diameter as the denominator. 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 There is new hypoattenuation along the left hippocampus, in the peripheral right occipital lobe, and at the right globus pallidus corresponding to acute infarcts on MRI. Few additional small infarcts are better seen on the MRI. There is no acute intracranial hemorrhage or significant mass effect. Ventricles are normal in size. Vascular: No new finding. Skull: Calvarium is unremarkable. Sinuses/Orbits: No acute finding. Other: None. Review of the MIP images confirms the above findings CTA NECK Aortic arch: Great vessel origins are patent Right carotid system: Patent. Mixed  plaque at the ICA origin causing minimal  stenosis. Left carotid system: Patent. Mixed plaque at the ICA origin causing minimal stenosis. Vertebral arteries: Patent.  Right vertebral artery is dominant. Skeleton: Degenerative changes of the cervical spine. Other neck: No mass or adenopathy. Upper chest: No apical lung mass. Review of the MIP images confirms the above findings CTA HEAD Anterior circulation: Intracranial internal carotid arteries are patent with mild calcified plaque. Anterior and middle cerebral arteries are patent. Posterior circulation: Intracranial vertebral arteries, basilar artery, and posterior cerebral arteries are patent. Bilateral posterior communicating arteries are present. Venous sinuses: Patent as allowed by contrast bolus timing. Review of the MIP images confirms the above findings IMPRESSION: Evolving acute infarctions more completely seen on prior MRI. No acute intracranial hemorrhage. No large vessel occlusion or hemodynamically significant stenosis. Electronically Signed   By: Guadlupe Spanish M.D.   On: 10/20/2019 12:15   CT ANGIO NECK CODE STROKE  Result Date: 10/20/2019 CLINICAL DATA:  Stroke, follow-up EXAM: CT ANGIOGRAPHY HEAD AND NECK TECHNIQUE: Multidetector CT imaging of the head and neck was performed using the standard protocol during bolus administration of intravenous contrast. Multiplanar CT image reconstructions and MIPs were obtained to evaluate the vascular anatomy. Carotid stenosis measurements (when applicable) are obtained utilizing NASCET criteria, using the distal internal carotid diameter as the denominator. CONTRAST:  55mL OMNIPAQUE IOHEXOL 350 MG/ML SOLN COMPARISON:  CT and MRI 10/19/2019 FINDINGS: CT HEAD Brain: Increased visibility of evolving acute infarction of the left parietal lobe involving the postcentral gyrus with some precentral gyrus extension. There is new hypoattenuation along the left hippocampus, in the peripheral right occipital lobe, and at  the right globus pallidus corresponding to acute infarcts on MRI. Few additional small infarcts are better seen on the MRI. There is no acute intracranial hemorrhage or significant mass effect. Ventricles are normal in size. Vascular: No new finding. Skull: Calvarium is unremarkable. Sinuses/Orbits: No acute finding. Other: None. Review of the MIP images confirms the above findings CTA NECK Aortic arch: Great vessel origins are patent Right carotid system: Patent. Mixed plaque at the ICA origin causing minimal stenosis. Left carotid system: Patent. Mixed plaque at the ICA origin causing minimal stenosis. Vertebral arteries: Patent.  Right vertebral artery is dominant. Skeleton: Degenerative changes of the cervical spine. Other neck: No mass or adenopathy. Upper chest: No apical lung mass. Review of the MIP images confirms the above findings CTA HEAD Anterior circulation: Intracranial internal carotid arteries are patent with mild calcified plaque. Anterior and middle cerebral arteries are patent. Posterior circulation: Intracranial vertebral arteries, basilar artery, and posterior cerebral arteries are patent. Bilateral posterior communicating arteries are present. Venous sinuses: Patent as allowed by contrast bolus timing. Review of the MIP images confirms the above findings IMPRESSION: Evolving acute infarctions more completely seen on prior MRI. No acute intracranial hemorrhage. No large vessel occlusion or hemodynamically significant stenosis. Electronically Signed   By: Guadlupe Spanish M.D.   On: 10/20/2019 12:15    Scheduled Meds: .  stroke: mapping our early stages of recovery book   Does not apply Once  . aspirin EC  81 mg Oral Daily  . clopidogrel  75 mg Oral Daily  . enoxaparin (LOVENOX) injection  30 mg Subcutaneous Q24H  . metoCLOPramide (REGLAN) injection  10 mg Intravenous Q6H  . nicotine  21 mg Transdermal Daily   Continuous Infusions: . sodium chloride 125 mL/hr at 10/20/19 1807   PRN  Meds: acetaminophen **OR** acetaminophen (TYLENOL) oral liquid 160 mg/5 mL **OR** acetaminophen, LORazepam, morphine injection, promethazine  Time spent:  35 minutes  Author: Lynden Oxford, MD [facility] 10/20/2019 7:00 PM  To reach On-call, see care teams to locate the attending and reach out via [email or web address]. Between 7PM-7AM, please contact night-coverage If you still have difficulty reaching the attending provider, please page the [facility] (Director on Call) for [facility] on amion for assistance.

## 2019-10-20 NOTE — Progress Notes (Signed)
Attempted carotid artery duplex, however patient uncooperative and unable to lie still. Will attempt again when patient is cooperative, as schedule permits.  10/20/2019  Eula Fried., MHA, RVT, RDCS, RDMS

## 2019-10-20 NOTE — Progress Notes (Signed)
Spoke with ordering Doctor for EEG will attempt EEG if pt not cooperative today we can hold off until tomorrow

## 2019-10-20 NOTE — ED Notes (Signed)
Pt currently asleep. No additional episodes of vomiting following administration of 2 mg IV ativan. Vital signs stable.

## 2019-10-20 NOTE — Progress Notes (Signed)
EEG complete - results pending 

## 2019-10-20 NOTE — Progress Notes (Signed)
  Echocardiogram 2D Echocardiogram has been performed.  Celene Skeen 10/20/2019, 12:27 PM

## 2019-10-20 NOTE — ED Notes (Addendum)
Attempted report x1. 

## 2019-10-20 NOTE — Progress Notes (Signed)
STROKE TEAM PROGRESS NOTE   HISTORY OF PRESENT ILLNESS (per record) Danielle Wong is an 59 y.o. female who presented to the [facility] ED from home via EMS on Friday afternoon. LKN was on Thursday at "supper time" per daughter. Her daughter attempted to contact the patient this morning unsuccessfully and then called EMS. EMS found patient sitting in a chair confused, only alert to name and birthday. Right eyebrow hematoma was noted. Per daughter, patient stated falling a lot on the day of admission. Daughter told EMS that the patient has a history of Xanax abuse and COPD. The patient has been staying at a hotel in [place] for the past 3 months since moving from [place]. She states she moved to [facility] for a "change of scene" and is looking for a permanent home here.   On arrival to the ED, the patient was disoriented, being unsure of current year as well as the current president. She had difficulty answering questions, but wasable to follow commands. The patient told EDP that she woke up in the morning feeling dizzy and had multiple falls but was unable to provide details. She also gave the same answers to the Neurologist, endorsing memory loss for recent events. She had multiple abrasions to her extremitiesin various stages of healingas well as a small hematoma above her right eye.   CT head revealed symmetric decreased attenuation in each posterior mid-cerebellar hemisphere. Possible edema was seen in the superolateral aspect of the left parietal lobe. There was concern for multifocal infarcts. Calcification was noted in the left carotid siphon region. Foci of carotid artery calcification were noted bilaterally in the neck.  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 Minimal chronic microvascular ischemic changes.  LSN: Thursday night tPA Given:  No: Out of time window   INTERVAL HISTORY I have personally reviewed history of presenting illness with the patient, electronic medical records and imaging films in PACS.  She presented with confusion and altered mental status and MRI scan shows bilateral cerebellar, splenium corpus callosum and globus pallidus infarcts likely from central cardiac etiology.  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  Patient has been restless and uncooperative with test and refusing EEG and therapies eval    OBJECTIVE Vitals:   10/20/19 0700 10/20/19 0730 10/20/19 0800 10/20/19 0830  BP: (!) 152/81 (!) 145/82 130/89 135/89  Pulse:  97 89 84  Resp: 15 (!) 22 17 19   Temp:      TempSrc:      SpO2:  95% 97% 97%  Weight:      Height:        CBC:  Recent Labs  Lab 10/19/19 1358  WBC 14.6*  HGB 13.8  HCT 45.1  MCV 102.5*  PLT 192    Basic Metabolic Panel:  Recent Labs  Lab 10/19/19 1358  NA 141  K 4.6  CL 102  CO2 24  GLUCOSE 98  BUN 33*  CREATININE 1.37*  CALCIUM 9.0    Lipid Panel: No results found for: CHOL, TRIG, HDL, CHOLHDL, VLDL, LDLCALC HgbA1c: No results found for: HGBA1C Urine Drug Screen:     Component Value Date/Time   LABOPIA NONE DETECTED 10/19/2019 2009   COCAINSCRNUR POSITIVE (A) 10/19/2019 2009   LABBENZ POSITIVE (A) 10/19/2019 2009   AMPHETMU NONE DETECTED 10/19/2019 2009   THCU NONE DETECTED  10/19/2019 2009   LABBARB NONE DETECTED 10/19/2019 2009    Alcohol Level     Component Value Date/Time   ETH <10 10/19/2019 1358    IMAGING  MR BRAIN WO CONTRAST 10/19/2019   IMPRESSION:  Multifocal acute infarcts involving the left parietal lobe, left centrum semiovale, bilateral globi pallidi, left hippocampus, right occipital lobe and bilateral cerebellar hemispheres. Minimal chronic microvascular ischemic changes.   CT Head Wo Contrast 10/19/2019 IMPRESSION:  Symmetric decreased attenuation in  each posterior mid cerebellar hemisphere. Suspect edema in the superolateral aspect of the left parietal lobe. Concern for multifocal infarcts. MR correlation advised. No mass or hemorrhage. Calcification noted in the left carotid siphon region. Paranasal sinus disease noted with diffuse opacification of the sphenoid sinus region which may well be chronic given the overall appearance.   CTA Head and Neck - pending   CT Cervical Spine Wo Contrast 10/19/2019 IMPRESSION:  No fracture or spondylolisthesis. Multilevel osteoarthritic change. No disc extrusion or stenosis. Upper lobe emphysematous change noted. Foci of carotid artery calcification noted bilaterally.   DG Pelvis 1-2 Views 10/19/2019 IMPRESSION:  1. No metal foreign body projects over the pelvis.  2. Upper normal amount of stool in the colon.    DG Shoulder Right 10/19/2019 IMPRESSION:  No acute finding in the right shoulder.   DG Abd 1 View 10/19/2019 IMPRESSION:  No metallic foreign object in the abdomen.   DG Chest Portable 1 View 10/19/2019 IMPRESSION:  No acute abnormality in the chest.   Transthoracic Echocardiogram  00/00/2021 Pending   ECG - SR rate 86 BPM. (See cardiology reading for complete details)   EEG - pending   PHYSICAL EXAM Blood pressure 135/89, pulse 84, temperature 97.9 F (36.6 C), temperature source Oral, resp. rate 19, height 5\' 5"  (1.651 m), weight 54.4 kg, SpO2 97 %. Frail middle-aged Caucasian lady not in distress.  She is restless and gets intermittently agitated. . Afebrile. Head is nontraumatic. Neck is supple without bruit.    Cardiac exam no murmur or gallop. Lungs are clear to auscultation. Distal pulses are well felt.  Neurological Exam ;  Awake  Alert oriented x 2.  Diminished attention, registration and recall.  Poor insight into her illness.  Easily distractible.. Normal speech and language.eye movements full without nystagmus.fundi were not visualized. Vision acuity and fields  appear normal. Hearing is normal. Palatal movements are normal. Face symmetric. Tongue midline. Normal strength, tone, reflexes except mild diminished fine finger movements on the right.  Mild right grip weakness though effort is poor and variable.  Orbits left over right upper extremity.  Mild right finger-to-nose dysmetria.  Normal sensation. Gait deferred.        ASSESSMENT/PLAN Danielle Wong is a 59 y.o. female who recently moved here from 46 with history Xanax abuse, (cocaine on drug screen) and COPD with ongoing tobacco use, was found sitting in a chair confused, only alert to name and birthday. Right eyebrow hematoma was noted. Per daughter, patient stated falling a lot on the day of admission. Pt reported dizziness. 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 Suspect edema in the superolateral aspect of the left parietal lobe. Concern for multifocal infarcts. MR correlation advised. No  mass or hemorrhage. Calcification noted in the left carotid siphon region.  MRI head - Multifocal acute infarcts involving the left parietal lobe, left centrum semiovale, bilateral globi pallidi, left hippocampus, right occipital lobe and bilateral cerebellar hemispheres. Minimal chronic microvascular ischemic changes.   MRA head - not ordered  CTA H&N - pending (creatinine 1.37)  CT Perfusion - not ordered  Carotid Doppler - CTA neck ordered - carotid dopplers not indicated - pending  2D Echo - pending  EEG - pending  [employer] Virus 2 - negative  LDL - pending  HgbA1c - pending  UDS - benzodiazepine and cocaine  VTE prophylaxis - Lovenox Diet  Diet Order            Diet  Heart Room service appropriate? Yes; Fluid consistency: Thin  Diet effective now                 No antithrombotic prior to admission, now on  ASA  And plavix.  Patient will be counseled to be compliant with her antithrombotic medications  Ongoing aggressive stroke risk factor management  Therapy recommendations:  pending  Disposition:  Pending  Hypertension  Home BP meds: Norvasc  Current BP meds: none   Stable . Permissive hypertension (OK if < 220/120) but gradually normalize in 5-7 days  . 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  Patient counseled to quit cocaine and drugs.  Continue ongoing stroke work-up and EEG if patient cooperates.  Discussed with Dr. Allena Katz.  Greater than 50% time during this 35-minute visit was spent on counseling and coordination of care about her embolic stroke and discussion and evaluation and treatment plan and answering questions. Hospital day # 1  Delia Heady, MD  To contact Stroke Continuity provider, please refer to [email or web address]. After hours, contact General Neurology

## 2019-10-21 ENCOUNTER — Inpatient Hospital Stay (HOSPITAL_COMMUNITY): Payer: Medicaid Other

## 2019-10-21 LAB — CBC
HCT: 43.4 % (ref 36.0–46.0)
Hemoglobin: 14 g/dL (ref 12.0–15.0)
MCH: 31.9 pg (ref 26.0–34.0)
MCHC: 32.3 g/dL (ref 30.0–36.0)
MCV: 98.9 fL (ref 80.0–100.0)
Platelets: 142 10*3/uL — ABNORMAL LOW (ref 150–400)
RBC: 4.39 MIL/uL (ref 3.87–5.11)
RDW: 14.1 % (ref 11.5–15.5)
WBC: 10 10*3/uL (ref 4.0–10.5)
nRBC: 0 % (ref 0.0–0.2)

## 2019-10-21 LAB — BASIC METABOLIC PANEL
Anion gap: 11 (ref 5–15)
BUN: 11 mg/dL (ref 6–20)
CO2: 25 mmol/L (ref 22–32)
Calcium: 8.8 mg/dL — ABNORMAL LOW (ref 8.9–10.3)
Chloride: 103 mmol/L (ref 98–111)
Creatinine, Ser: 0.67 mg/dL (ref 0.44–1.00)
GFR calc Af Amer: >60 mL/min (ref >60)
GFR calc non Af Amer: >60 mL/min (ref >60)
Glucose, Bld: 75 mg/dL (ref 70–99)
Potassium: 3.7 mmol/L (ref 3.5–5.1)
Sodium: 139 mmol/L (ref 135–145)

## 2019-10-21 MED ORDER — THIAMINE HCL 100 MG PO TABS
100.0000 mg | ORAL_TABLET | Freq: Every day | ORAL | Status: DC
  Administered 2019-10-21 – 2019-10-23 (×3): 100 mg via ORAL
  Filled 2019-10-21 (×3): qty 1

## 2019-10-21 MED ORDER — LORAZEPAM 2 MG/ML IJ SOLN
0.0000 mg | Freq: Four times a day (QID) | INTRAMUSCULAR | Status: DC
  Filled 2019-10-21: qty 2
  Filled 2019-10-21: qty 1

## 2019-10-21 MED ORDER — SENNOSIDES-DOCUSATE SODIUM 8.6-50 MG PO TABS
1.0000 | ORAL_TABLET | Freq: Two times a day (BID) | ORAL | Status: DC
  Administered 2019-10-21 – 2019-10-23 (×5): 1 via ORAL
  Filled 2019-10-21 (×5): qty 1

## 2019-10-21 MED ORDER — THIAMINE HCL 100 MG/ML IJ SOLN: 100.0000 mg | Freq: Every day | INTRAMUSCULAR | Status: DC

## 2019-10-21 MED ORDER — QUETIAPINE FUMARATE 200 MG PO TABS
200.0000 mg | ORAL_TABLET | Freq: Every day | ORAL | Status: DC
  Administered 2019-10-21: 200 mg via ORAL
  Filled 2019-10-21: qty 1

## 2019-10-21 MED ORDER — LORAZEPAM 2 MG/ML IJ SOLN: 0.0000 mg | Freq: Two times a day (BID) | INTRAMUSCULAR | Status: DC

## 2019-10-21 MED ORDER — LORAZEPAM 2 MG/ML IJ SOLN
1.0000 mg | INTRAMUSCULAR | Status: DC | PRN
  Administered 2019-10-21: 1 mg via INTRAVENOUS
  Administered 2019-10-21: 4 mg via INTRAVENOUS
  Administered 2019-10-21: 1 mg via INTRAVENOUS
  Filled 2019-10-21: qty 1

## 2019-10-21 MED ORDER — ADULT MULTIVITAMIN W/MINERALS CH
1.0000 | ORAL_TABLET | Freq: Every day | ORAL | Status: DC
  Administered 2019-10-21 – 2019-10-23 (×3): 1 via ORAL
  Filled 2019-10-21 (×3): qty 1

## 2019-10-21 MED ORDER — FOLIC ACID 1 MG PO TABS
1.0000 mg | ORAL_TABLET | Freq: Every day | ORAL | Status: DC
  Administered 2019-10-21 – 2019-10-23 (×3): 1 mg via ORAL
  Filled 2019-10-21 (×3): qty 1

## 2019-10-21 MED ORDER — LORAZEPAM 1 MG PO TABS
1.0000 mg | ORAL_TABLET | ORAL | Status: DC | PRN
  Administered 2019-10-22: 1 mg via ORAL
  Filled 2019-10-21: qty 1

## 2019-10-21 NOTE — Progress Notes (Signed)
STROKE TEAM PROGRESS NOTE      INTERVAL HISTORY Patient is sitting up in bed.  She is doing better today.  She states is improving but is almost back to baseline.  She wants to go home.  CT angiogram showed no significant large vessel stenosis or occlusion.  Echocardiogram showed normal ejection fraction of 60 to 65% without any cardiac source of embolism.  EEG shows moderate diffuse slowing.  HIV is negative LDL cholesterol is 132 mg percent hemoglobin A1c 6.3.    OBJECTIVE Vitals:   10/20/19 2055 10/20/19 2358 10/21/19 0348 10/21/19 0850  BP: (!) 158/69 (!) 157/90 (!) 151/88 (!) 166/101  Pulse: 96 91 93 96  Resp: 18 18 18 20   Temp: 98 F (36.7 C) 98.2 F (36.8 C) 98.1 F (36.7 C) 98.2 F (36.8 C)  TempSrc: Oral Oral Oral Oral  SpO2: 96% 98% 96% 91%  Weight: 54.6 kg     Height: 5\' 5"  (1.651 m)       CBC:  Recent Labs  Lab 10/19/19 1358 10/21/19 0310  WBC 14.6* 10.0  HGB 13.8 14.0  HCT 45.1 43.4  MCV 102.5* 98.9  PLT 192 142*    Basic Metabolic Panel:  Recent Labs  Lab 10/19/19 1358 10/19/19 1358 10/20/19 1049 10/21/19 0310  NA 141  --   --  139  K 4.6  --   --  3.7  CL 102  --   --  103  CO2 24  --   --  25  GLUCOSE 98  --   --  75  BUN 33*  --   --  11  CREATININE 1.37*   < > 0.83 0.67  CALCIUM 9.0  --   --  8.8*   < > = values in this interval not displayed.    Lipid Panel:     Component Value Date/Time   CHOL 218 (H) 10/20/2019 1049   TRIG 77 10/20/2019 1049   HDL 71 10/20/2019 1049   CHOLHDL 3.1 10/20/2019 1049   VLDL 15 10/20/2019 1049   LDLCALC 132 (H) 10/20/2019 1049   HgbA1c:  Lab Results  Component Value Date   HGBA1C 6.3 (H) 10/20/2019   Urine Drug Screen:     Component Value Date/Time   LABOPIA NONE DETECTED 10/19/2019 2009   COCAINSCRNUR POSITIVE (A) 10/19/2019 2009   LABBENZ POSITIVE (A) 10/19/2019 2009   AMPHETMU NONE DETECTED 10/19/2019 2009   THCU NONE DETECTED 10/19/2019 2009   LABBARB NONE DETECTED 10/19/2019 2009     Alcohol Level     Component Value Date/Time   ETH <10 10/19/2019 1358    IMAGING  MR BRAIN WO CONTRAST 10/19/2019   IMPRESSION:  Multifocal acute infarcts involving the left parietal lobe, left centrum semiovale, bilateral globi pallidi, left hippocampus, right occipital lobe and bilateral cerebellar hemispheres. Minimal chronic microvascular ischemic changes.   CT Head Wo Contrast 10/19/2019 IMPRESSION:  Symmetric decreased attenuation in each posterior mid cerebellar hemisphere. Suspect edema in the superolateral aspect of the left parietal lobe. Concern for multifocal infarcts. MR correlation advised. No mass or hemorrhage. Calcification noted in the left carotid siphon region. Paranasal sinus disease noted with diffuse opacification of the sphenoid sinus region which may well be chronic given the overall appearance.   CTA Head and Neck  10/20/19 IMPRESSION: Evolving acute infarctions more completely seen on prior MRI. No acute intracranial hemorrhage. No large vessel occlusion or hemodynamically significant stenosis.  CT Cervical Spine Wo Contrast 10/19/2019 IMPRESSION:  No fracture or spondylolisthesis. Multilevel osteoarthritic change. No disc extrusion or stenosis. Upper lobe emphysematous change noted. Foci of carotid artery calcification noted bilaterally.   DG Pelvis 1-2 Views 10/19/2019 IMPRESSION:  1. No metal foreign body projects over the pelvis.  2. Upper normal amount of stool in the colon.    DG Shoulder Right 10/19/2019 IMPRESSION:  No acute finding in the right shoulder.   DG Abd 1 View 10/19/2019 IMPRESSION:  No metallic foreign object in the abdomen.   DG Chest Portable 1 View 10/19/2019 IMPRESSION:  No acute abnormality in the chest.   Transthoracic Echocardiogram  10/20/19 IMPRESSIONS  1. Left ventricular ejection fraction, by estimation, is 60 to 65%. The  left ventricle has normal function. The left ventricle has no regional  wall motion  abnormalities. Left ventricular diastolic parameters were  normal.  2. Right ventricular systolic function is normal. The right ventricular  size is mildly enlarged. Tricuspid regurgitation signal is inadequate for  assessing PA pressure.  3. The mitral valve is grossly normal. No evidence of mitral valve  regurgitation. No evidence of mitral stenosis.  4. The aortic valve is tricuspid. Aortic valve regurgitation is not  visualized. Mild aortic valve sclerosis is present, with no evidence of  aortic valve stenosis.  5. The inferior vena cava is dilated in size with <50% respiratory  variability, suggesting right atrial pressure of 15 mmHg.   ECG - SR rate 86 BPM. (See cardiology reading for complete details)   EEG  10/20/19 ABNORMALITY -Continuous slow, generalized  IMPRESSION: This study is suggestive of moderate diffuse encephalopathy, nonspecific etiology. No seizures or epileptiform discharges were seen throughout the recording.    PHYSICAL EXAM Blood pressure (!) 166/101, pulse 96, temperature 98.2 F (36.8 C), temperature source Oral, resp. rate 20, height 5\' 5"  (1.651 m), weight 54.6 kg, SpO2 91 %. Frail middle-aged Caucasian lady not in distress.  She is restless and gets intermittently agitated. . Afebrile. Head is nontraumatic. Neck is supple without bruit.    Cardiac exam no murmur or gallop. Lungs are clear to auscultation. Distal pulses are well felt.  Neurological Exam ;  Awake  Alert oriented x 2.  Pleasant and cooperative today.  Diminished recall 1/3... Normal speech and language.eye movements full without nystagmus.fundi were not visualized. Vision acuity and fields appear normal. Hearing is normal. Palatal movements are normal. Face symmetric. Tongue midline. Normal strength, tone, reflexes except mild diminished fine finger movements on the right.  Mild right grip weakness though effort is poor and variable.  Orbits left over right upper extremity.  Mild right  finger-to-nose dysmetria.  Normal sensation. Gait deferred.        ASSESSMENT/PLAN Ms. Danielle Wong is a 59 y.o. female who recently moved here from 46 with history Xanax abuse, (cocaine on drug screen) and COPD with ongoing tobacco use, was found sitting in a chair confused, only alert to name and birthday. Right eyebrow hematoma was noted. Per daughter, patient stated falling a lot on the day of admission. Pt reported dizziness. 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 Suspect edema in the superolateral aspect of the left parietal lobe. Concern for multifocal infarcts. MR correlation advised. No mass or  hemorrhage. Calcification noted in the left carotid siphon region.  MRI head - Multifocal acute infarcts involving the left parietal lobe, left centrum semiovale, bilateral globi pallidi, left hippocampus, right occipital lobe and bilateral cerebellar hemispheres. Minimal chronic microvascular ischemic changes.   MRA head - not ordered  CTA H&N - Evolving acute infarctions more completely seen on prior MRI. No acute intracranial hemorrhage. No large vessel occlusion or hemodynamically significant stenosis.  CT Perfusion - not ordered  Carotid Doppler - CTA neck ordered - carotid dopplers not indicated   2D Echo - EF 60 - 65%. No cardiac source of emboli identified.   EEG -This study is suggestive of moderate diffuse encephalopathy, nonspecific etiology. No seizures or epileptiform discharges were seen throughout the recording.   [employer] Virus 2 - negative  LDL - 132  HgbA1c - 6.3  UDS - benzodiazepine and cocaine  VTE prophylaxis - Lovenox Diet  Diet Order             Diet full liquid Room service appropriate? Yes; Fluid consistency: Thin  Diet effective now                 No antithrombotic prior to admission, now on  ASA  and Plavix.  Patient will be counseled to be compliant with her antithrombotic medications  Ongoing aggressive stroke risk factor management  Therapy recommendations:  pending  Disposition:  Pending  Hypertension  Home BP meds: Norvasc  Current BP meds: none   Stable . Permissive hypertension (OK if < 220/120) but gradually normalize in 5-7 days  . 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  Other Active Problems  Code status - Full code  Leukocytosis - WBC's - 14.6->10.0  (afebrile) CKD - stage 3 - creatinine - 1.37->0.67   PLAN Aspirin and Plavix for stroke prevention for 3 weeks followed by aspirin alone.  Patient counseled to quit cocaine.  Follow-up as an outpatient stroke clinic in 6 weeks.  Discussed with Dr. Allena Katz.  Greater than 50% time during this 25-minute visit was spent in counseling and coordination of care and answering questions.  Stroke team will sign off.  Can call for questions.  Delia Heady, MD  To contact Stroke Continuity provider, please refer to [email or web address]. After hours, contact General Neurology

## 2019-10-21 NOTE — Progress Notes (Signed)
1930 Pt assessed, see flow sheet, pt assisted to [facility], pt very anxious, fidgety. Kicking off blankets, then saying she's cold. Pt with rambling speech about her daughter. Pt updated with POC, WCTM.   2008 Pt called RN for bathroom assistance, pt assisted to [facility]. Tried to call her dgt Sam, but call went to voicemail. Pt upset her she can't get ahold of her dgt.   2100 Pt extremely fidgety, anxious, keeps saying she can't breath, she doesn't know how to work phone, RN assisted in calling her daughter Doreatha Martin, but call went straight to voicemail. Pt rambling speech, keeps trying to get OOB. Pt has intermitten bouts of dry heaving, no vomiting witnessed. Pt also noted to have tremors, cold/hot flashes, and extremely agitated. RN asked if she was withdrawaling from anything and she said her "home meds". Pt stated she takes 600mg  of seroqueol, dose ordered in patient was 200mg . RN assisted pt to [facility] and back to bed. Pt stated she hasn't slept in 3 days. CIWA screen performed, 19. Will medicate per [facility].   2130 RN back with meds, medicated per [facility], assisted pt to [facility] again. Pt still having dry heaves. Ativan given, WCTM.   2215 Pt finally sleeping comfortably, NAD, WCTM.   2305 Pt still sleeping, WCTM.
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
## 2019-10-21 NOTE — Evaluation (Addendum)
Physical Therapy Evaluation Patient Details Name: Danielle Wong MRN: [id number] DOB: 1960/08/12 Today's Date: 10/21/2019   History of Present Illness  59 y.o. female admitted on 10/19/19 for AMS.  Toxicology screen (+) for benzos and cocaine.  Head CT negative, however MRI revealed multifocal acute infarcts involving the L parietal lobe, L centrum semiovale, bil globi pallidi, L hippocampus, R occipital lobe, and bil cerebellar hemispheres.  Pt with significant PMH of COPD, HTN, anxiety, migraines.    Clinical Impression  Pt with nausea, mild gait instability, and R sided inattention (sits on her right hand several times).  R wist swelling and abrasion without wrist films (discussed with RN who agrees and will reach out to physician to see if x-rays are warranted).  She has moderate cognitive deficits (see SLP note).  She reports being on O2 at baseline, has a concentrator at her hotel.  She would benefit from post acute therapy if able.   PT to follow acutely for deficits listed below.      Follow Up Recommendations Home health PT    Equipment Recommendations  None recommended by PT    Recommendations for Other Services       Precautions / Restrictions Precautions Precautions: Other (comment) Precaution Comments: seems a bit inattentive to her R side      Mobility  Bed Mobility Overal bed mobility: Modified Independent                Transfers Overall transfer level: Needs assistance   Transfers: Sit to/from Stand Sit to Stand: Min guard         General transfer comment: min guard assist for safety   Ambulation/Gait Ambulation/Gait assistance: Min guard Gait Distance (Feet): 45 Feet Assistive device: 1 person hand held assist Gait Pattern/deviations: Step-through pattern Gait velocity: decreased Gait velocity interpretation: 1.31 - 2.62 ft/sec, indicative of limited community ambulator General Gait Details: Pt with slow, purposeful gait.  No significant LOB, but  not moving quickly.  No significant ataxia noted at this speed.  Nauseated after several laps around the room.  Portable O2 brought on 2 L with increased DOE (2/4) which is normal for her. VSS per finger pulse ox.    Stairs            Wheelchair Mobility    Modified Rankin (Stroke Patients Only) Modified Rankin (Stroke Patients Only) Pre-Morbid Rankin Score: No symptoms Modified Rankin: Moderately severe disability     Balance Overall balance assessment: Needs assistance Sitting-balance support: No upper extremity supported Sitting balance-Leahy Scale: Good     Standing balance support: Single extremity supported;No upper extremity supported Standing balance-Leahy Scale: Fair               High level balance activites: Other (comment) High Level Balance Comments: Pt able to reach down and pick up therapist's name badge attempted several times with R hand before therapist cued her to do it with her left hand.  Close supervision for safety.              Pertinent Vitals/Pain Pain Assessment: Faces Faces Pain Scale: Hurts even more Pain Location: R wrist Pain Descriptors / Indicators: Discomfort Pain Intervention(s): Limited activity within patient's tolerance;Monitored during session;Repositioned    Home Living Family/patient expects to be discharged to:: Private residence Living Arrangements: Alone Available Help at Discharge: Family;Available PRN/intermittently Type of Home: Other(Comment) (lives in a hotel) Home Access: Level entry     Home Layout: One level Home Equipment: None Additional Comments: wears glasses  on home O2    Prior Function Level of Independence: Independent         Comments: does not drive, does not work, has a daughter who is nearby, but reports she cannot help.      Hand Dominance   Dominant Hand: Right    Extremity/Trunk Assessment   Upper Extremity Assessment Upper Extremity Assessment: Defer to OT evaluation     Lower Extremity Assessment Lower Extremity Assessment: Generalized weakness    Cervical / Trunk Assessment Cervical / Trunk Assessment: Normal  Communication   Communication: No difficulties  Cognition Arousal/Alertness: Awake/alert Behavior During Therapy: WFL for tasks assessed/performed Overall Cognitive Status: Impaired/Different from baseline Area of Impairment: Attention;Safety/judgement;Awareness;Problem solving                   Current Attention Level: Selective     Safety/Judgement: Decreased awareness of safety;Decreased awareness of deficits Awareness: Emergent Problem Solving:  (deminished higher level) General Comments: Pt unabel to recall events surrponding her hospitalization, could not remember why she asked me to call her daughter, sits on her R hand several times during the session. Has difficulty with higher level problem solving (I asked her to pick something up off of the floor, she struggled and struggles with her right hand and had to be cued to switch to her left.        General Comments      Exercises     Assessment/Plan    PT Assessment Patient needs continued PT services  PT Problem List Decreased strength;Decreased activity tolerance;Decreased balance;Decreased mobility;Decreased knowledge of use of DME;Decreased cognition;Decreased safety awareness;Pain       PT Treatment Interventions DME instruction;Gait training;Stair training;Functional mobility training;Therapeutic activities;Therapeutic exercise;Balance training;Neuromuscular re-education;Cognitive remediation;Patient/family education    PT Goals (Current goals can be found in the Care Plan section)  Acute Rehab PT Goals Patient Stated Goal: to go home as soon as she can PT Goal Formulation: With patient Time For Goal Achievement: 11/04/19 Potential to Achieve Goals: Good    Frequency Min 4X/week   Barriers to discharge Decreased caregiver support      Co-evaluation                AM-PAC PT "6 Clicks" Mobility  Outcome Measure Help needed turning from your back to your side while in a flat bed without using bedrails?: None Help needed moving from lying on your back to sitting on the side of a flat bed without using bedrails?: None Help needed moving to and from a bed to a chair (including a wheelchair)?: None Help needed standing up from a chair using your arms (e.g., wheelchair or bedside chair)?: A Little Help needed to walk in hospital room?: A Little Help needed climbing 3-5 steps with a railing? : A Little 6 Click Score: 21    End of Session Equipment Utilized During Treatment: Oxygen Activity Tolerance: Patient limited by pain;Patient limited by fatigue Patient left: in bed;Other (comment);with bed alarm set (seated EOB) Nurse Communication: Mobility status;Other (comment) (R wrist swelling and pain) PT Visit Diagnosis: Muscle weakness (generalized) (M62.81);Difficulty in walking, not elsewhere classified (R26.2);Other symptoms and signs involving the nervous system (R29.898)    Time: 1610-9604 PT Time Calculation (min) (ACUTE ONLY): 24 min   Charges:   PT Evaluation $PT Eval Moderate Complexity: 1 Mod PT Treatments $Gait Training: 8-22 mins       Corinna Capra, PT, DPT  Acute Rehabilitation [phone] pager [phone]) [phone] office

## 2019-10-21 NOTE — Evaluation (Signed)
Speech Language Pathology Evaluation Patient Details Name: Danielle Wong MRN: [id number] DOB: Mar 18, 1960 Today's Date: 10/21/2019 Time: 7482-7078 SLP Time Calculation (min) (ACUTE ONLY): 25 min  Problem List:  Patient Active Problem List   Diagnosis Date Noted  . CVA (cerebral vascular accident) (HCC) 10/19/2019  . Cocaine abuse (HCC) 10/19/2019  . Benzodiazepine abuse (HCC) 10/19/2019  . Anxiety 10/19/2019  . Polysubstance abuse (HCC) 10/19/2019  . Bronchitis due to tobacco use 10/19/2019   Past Medical History: History reviewed. No pertinent past medical history. Past Surgical History: History reviewed. No pertinent surgical history. HPI:  Danielle Wong is a 59 y.o. female with medical history significant of polysubstance abuse, COPD, hypertension, anxiety disorder, migraine headaches.  MRI revealed multifocal acute infarcts involving the left parietal lobe, left centrum semioval, bilateral globi pallidi, left hippocampus, right occipital lobe and bilateral cerebellar hemispheres.  Assessment / Plan / Recommendation Clinical Impression  Pt was seen for a cognitive-linguistic evaluation in the setting of acute infarcts.  Pt was encountered awake/alert in bed and she was agreeable to this evaluation.  Upon SLP arrival, pt was trying to call her daughter via her cell phone and she stated that she unsure how to use the phone, but that she had been able to use it without difficulty prior to this admission.  Pt stated that she has been living in a hotel for the past few months while looking for a permanent home in the area.  She has two daughters who live near by, but unsure as to the level of help that they could provide at time of discharge.  Pt reported that she was independent with all IADLs including medication and financial management prior to this admission.  She was oriented to herself and the time, but she was not oriented to the year, state, or city.  Pt completed the [facility] SLUMS examination  and she scored overall 13/30, indicating a moderate cognitive impairment.  Pt exhibited the most difficulty with short-term memory, numeric problem solving, thought organization, and sustained attention.  Pt exhibited difficulty with divergent naming, otherwise expressive and receptive language appeared to be functional.  No dysarthria was observed.  Pt appeared to have some awareness of deficits, but she did not appear to understand how her deficits would impact her ability to complete daily activities.  She additionally appeared to have decreased safety awareness.  Recommend close supervision/assistance with all IADLs (medications, finances, cooking, etc.) at time of discharge and additional ST (home health) targeting cognitive-linguistic deficits.  Would not recommend that pt live alone at time of discharge secondary to safety concerns.  SLP spoke with pt, pt's daughter, and RN regarding results/recommendations and they verbalized understanding.  SLP will f/u for cognitive-linguistic treatment per POC.      SLP Assessment  SLP Recommendation/Assessment: Patient needs continued Speech Lanaguage Pathology Services SLP Visit Diagnosis: Cognitive communication deficit (R41.841)    Follow Up Recommendations  24 hour supervision/assistance;Home health SLP    Frequency and Duration min 2x/week  2 weeks      SLP Evaluation Cognition  Overall Cognitive Status: Impaired/Different from baseline Arousal/Alertness: Awake/alert Orientation Level: Oriented to person;Disoriented to place;Oriented to time Attention: Sustained;Focused Focused Attention: Appears intact Sustained Attention: Impaired Sustained Attention Impairment: Verbal complex Memory: Impaired Memory Impairment: Decreased short term memory Decreased Short Term Memory: Verbal basic Awareness: Impaired Awareness Impairment: Intellectual impairment Problem Solving: Impaired Problem Solving Impairment: Verbal complex;Functional  complex Executive Function: Landscape architect: Appears intact Safety/Judgment: Impaired       Comprehension  Auditory Comprehension Overall Auditory Comprehension: Appears within functional limits for tasks assessed Conversation: Complex    Expression Expression Primary Mode of Expression: Verbal Verbal Expression Overall Verbal Expression: Appears within functional limits for tasks assessed Written Expression Dominant Hand: Right Written Expression: Unable to assess (comment) (weakness)   Oral / Motor  Motor Speech Overall Motor Speech: Appears within functional limits for tasks assessed   GO                   Villa Herb M.S., CCC-SLP Acute Rehabilitation Services Office: [phone]  Shanon Rosser [facility] 10/21/2019, 10:55 AM

## 2019-10-21 NOTE — Progress Notes (Signed)
[facility] Progress Note  Patient: Danielle Wong    HOZ:[id number]  DOA: 10/19/2019     Date of Service: the patient was seen and examined on 10/21/2019  Brief hospital course: Past medical history of COPD, HTN, anxiety disorder, migraine, substance abuse. Presents with confusion and intractable nausea and vomiting. Found to have acute CVA likely from cocaine. Currently plan is for the stroke work-up.  Assessment and Plan: 1.  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 MRI brain confirmed the diagnosis. CTA head and neck negative for any acute large vessel occlusion. LDL 132 Statins initiated. Hemoglobin A1c 6.3. UDS positive for cocaine and benzo.  EEG shows encephalopathy. Echocardiogram shows no significant abnormality with preserved EF.  2.  Intractable nausea and vomiting Etiology not clear. CTA head and neck negative for any evidence of mass-effect or elevated intracranial pressure. No new focal deficit. X-ray abdomen negative for any obstruction. Continue scheduled Reglan.  Liquid diet.  3.  Substance abuse/cocaine positive Avoid beta-blockers. Monitor.  4.  Essential hypertension Blood pressure stable.  Monitor.  Permissive hypertension. Holding Norvasc  5.  Hyperlipidemia LDL pending.  Lipitor 40 mg daily.  6.  Active smoker Encourage the patient to quit smoking.  Currently not ready.  7.  Anxiety Ativan as needed.  8.  AKI. Not CKD. In the setting of intractable nausea and vomiting. Continue with IV fluid.  9.  Leukocytosis. Stress reaction. Monitor.  10.  macrocytosis. Check B12 and folic acid  11. Right wrist pain X ray negative  Diet: Liquid diet DVT Prophylaxis:   enoxaparin (LOVENOX) injection 30 mg Start: 10/20/19 1000  Advance goals of care discussion: Full code  Family Communication: no family was present at bedside, at the time of  interview.   Disposition:  Status is: Inpatient  Remains inpatient appropriate because:Ongoing diagnostic testing needed not appropriate for outpatient work up  Dispo: The patient is from: Home              Anticipated d/c is to: Home              Anticipated d/c date is: 2 days              Patient currently is not medically stable to d/c.  Subjective: no nausea no vomiting no chest pain.   Physical Exam:  General: Appear in mild distress, no Rash; Oral Mucosa Clear, moist. no Abnormal Neck Mass Or lumps, Conjunctiva normal  Cardiovascular: S1 and S2 Present, no Murmur, Respiratory: good respiratory effort, Bilateral Air entry present and Clear to Auscultation, no Crackles, no wheezes Abdomen: Bowel Sound present, Soft and no tenderness Extremities: no Pedal edema, no calf tenderness Neurology: alert and oriented to time, place, and person affect appropriate. no new focal deficit Decreased attention and recall.  Normal speech Mild right-sided weakness.  Pupils are reactive. Gait not checked due to patient safety concerns  Vitals:   10/21/19 0348 10/21/19 0850 10/21/19 1133 10/21/19 1550  BP: (!) 151/88 (!) 166/101 (!) 166/99 (!) 163/99  Pulse: 93 96 90 90  Resp: 18 20  20   Temp: 98.1 F (36.7 C) 98.2 F (36.8 C) 98.7 F (37.1 C) 98 F (36.7 C)  TempSrc: Oral Oral Oral Oral  SpO2: 96% 91% 98% 97%  Weight:      Height:        Intake/Output Summary (Last 24 hours) at 10/21/2019 1833 Last data filed at 10/21/2019 0600 Gross per  24 hour  Intake 2929.89 ml  Output --  Net 2929.89 ml   Filed Weights   10/19/19 1248 10/20/19 0646 10/20/19 2055  Weight: 54.4 kg 54.4 kg 54.6 kg    Data Reviewed: I have personally reviewed and interpreted daily labs, tele strips, imagings as discussed above. I reviewed all nursing notes, pharmacy notes, vitals, pertinent old records I have discussed plan of care as described above with RN and patient/family.  CBC: Recent Labs  Lab  10/19/19 1358 10/21/19 0310  WBC 14.6* 10.0  HGB 13.8 14.0  HCT 45.1 43.4  MCV 102.5* 98.9  PLT 192 142*   Basic Metabolic Panel: Recent Labs  Lab 10/19/19 1358 10/20/19 1049 10/21/19 0310  NA 141  --  139  K 4.6  --  3.7  CL 102  --  103  CO2 24  --  25  GLUCOSE 98  --  75  BUN 33*  --  11  CREATININE 1.37* 0.83 0.67  CALCIUM 9.0  --  8.8*    Studies: DG Wrist Complete Right  Result Date: 10/21/2019 CLINICAL DATA:  Pain and swelling EXAM: RIGHT WRIST - COMPLETE 3+ VIEW COMPARISON:  None. FINDINGS: No acute fracture or dislocation. No aggressive osseous lesion. Normal alignment. Mild osteoarthritis of first CMC joint. Soft tissue are unremarkable. No radiopaque foreign body or soft tissue emphysema. IMPRESSION: No acute osseous injury of the right wrist. Electronically Signed   By: Elige Ko   On: 10/21/2019 14:37    Scheduled Meds: .  stroke: mapping our early stages of recovery book   Does not apply Once  . aspirin EC  81 mg Oral Daily  . clopidogrel  75 mg Oral Daily  . enoxaparin (LOVENOX) injection  30 mg Subcutaneous Q24H  . folic acid  1 mg Oral Daily  . LORazepam  0-4 mg Intravenous Q6H   Followed by  . [START ON 10/23/2019] LORazepam  0-4 mg Intravenous Q12H  . metoCLOPramide (REGLAN) injection  10 mg Intravenous Q6H  . multivitamin with minerals  1 tablet Oral Daily  . nicotine  21 mg Transdermal Daily  . QUEtiapine  200 mg Oral QHS  . senna-docusate  1 tablet Oral BID  . thiamine  100 mg Oral Daily   Or  . thiamine  100 mg Intravenous Daily   Continuous Infusions:  PRN Meds: acetaminophen **OR** acetaminophen (TYLENOL) oral liquid 160 mg/5 mL **OR** acetaminophen, LORazepam **OR** LORazepam, morphine injection, promethazine  Time spent: 35 minutes  Author: Lynden Oxford, MD [facility] 10/21/2019 6:33 PM  To reach On-call, see care teams to locate the attending and reach out via [email or web address]. Between 7PM-7AM, please contact  night-coverage If you still have difficulty reaching the attending provider, please page the [facility] (Director on Call) for [facility] on amion for assistance.

## 2019-10-22 DIAGNOSIS — Z720 Tobacco use: Secondary | ICD-10-CM

## 2019-10-22 DIAGNOSIS — J40 Bronchitis, not specified as acute or chronic: Secondary | ICD-10-CM

## 2019-10-22 LAB — BASIC METABOLIC PANEL
Anion gap: 9 (ref 5–15)
BUN: 6 mg/dL (ref 6–20)
CO2: 29 mmol/L (ref 22–32)
Calcium: 9 mg/dL (ref 8.9–10.3)
Chloride: 103 mmol/L (ref 98–111)
Creatinine, Ser: 0.58 mg/dL (ref 0.44–1.00)
GFR calc Af Amer: >60 mL/min (ref >60)
GFR calc non Af Amer: >60 mL/min (ref >60)
Glucose, Bld: 96 mg/dL (ref 70–99)
Potassium: 2.9 mmol/L — ABNORMAL LOW (ref 3.5–5.1)
Sodium: 141 mmol/L (ref 135–145)

## 2019-10-22 LAB — CBC
HCT: 43.7 % (ref 36.0–46.0)
Hemoglobin: 14.2 g/dL (ref 12.0–15.0)
MCH: 31 pg (ref 26.0–34.0)
MCHC: 32.5 g/dL (ref 30.0–36.0)
MCV: 95.4 fL (ref 80.0–100.0)
Platelets: 158 10*3/uL (ref 150–400)
RBC: 4.58 MIL/uL (ref 3.87–5.11)
RDW: 13.9 % (ref 11.5–15.5)
WBC: 8.1 10*3/uL (ref 4.0–10.5)
nRBC: 0 % (ref 0.0–0.2)

## 2019-10-22 LAB — VITAMIN B12: Vitamin B-12: 507 pg/mL (ref 180–914)

## 2019-10-22 LAB — FOLATE: Folate: 10.7 ng/mL (ref >5.9)

## 2019-10-22 MED ORDER — QUETIAPINE FUMARATE 200 MG PO TABS
600.0000 mg | ORAL_TABLET | Freq: Every day | ORAL | Status: DC
  Administered 2019-10-22: 600 mg via ORAL
  Filled 2019-10-22: qty 3

## 2019-10-22 MED ORDER — POTASSIUM CHLORIDE CRYS ER 20 MEQ PO TBCR
40.0000 meq | EXTENDED_RELEASE_TABLET | ORAL | Status: AC
  Administered 2019-10-22 (×2): 40 meq via ORAL
  Filled 2019-10-22 (×2): qty 2

## 2019-10-22 MED ORDER — AMLODIPINE BESYLATE 5 MG PO TABS
5.0000 mg | ORAL_TABLET | Freq: Every day | ORAL | Status: DC
  Administered 2019-10-22 – 2019-10-23 (×2): 5 mg via ORAL
  Filled 2019-10-22 (×2): qty 1

## 2019-10-22 MED ORDER — ALBUTEROL SULFATE (2.5 MG/3ML) 0.083% IN NEBU: 3.0000 mL | INHALATION_SOLUTION | Freq: Four times a day (QID) | RESPIRATORY_TRACT | Status: DC | PRN

## 2019-10-22 MED ORDER — POTASSIUM CHLORIDE 20 MEQ PO PACK
40.0000 meq | PACK | ORAL | Status: DC
  Filled 2019-10-22: qty 2

## 2019-10-22 MED ORDER — DULOXETINE HCL 60 MG PO CPEP
60.0000 mg | ORAL_CAPSULE | Freq: Two times a day (BID) | ORAL | Status: DC
  Administered 2019-10-22 – 2019-10-23 (×2): 60 mg via ORAL
  Filled 2019-10-22 (×2): qty 1

## 2019-10-22 MED ORDER — ALPRAZOLAM 0.5 MG PO TABS
1.0000 mg | ORAL_TABLET | Freq: Three times a day (TID) | ORAL | Status: DC
  Administered 2019-10-22 – 2019-10-23 (×3): 1 mg via ORAL
  Filled 2019-10-22 (×4): qty 2

## 2019-10-22 MED ORDER — ENOXAPARIN SODIUM 40 MG/0.4ML ~~LOC~~ SOLN: 40.0000 mg | SUBCUTANEOUS | Status: DC

## 2019-10-22 MED ORDER — BUDESONIDE 0.25 MG/2ML IN SUSP
0.2500 mg | Freq: Two times a day (BID) | RESPIRATORY_TRACT | Status: DC
  Administered 2019-10-22 – 2019-10-23 (×2): 0.25 mg via RESPIRATORY_TRACT
  Filled 2019-10-22 (×2): qty 2

## 2019-10-22 MED ORDER — LINACLOTIDE 72 MCG PO CAPS
72.0000 ug | ORAL_CAPSULE | Freq: Every day | ORAL | Status: DC
  Administered 2019-10-23: 72 ug via ORAL
  Filled 2019-10-22: qty 1

## 2019-10-22 NOTE — [place] FL2 (Signed)
[place] MEDICAID FL2 LEVEL OF CARE SCREENING TOOL     IDENTIFICATION  Patient Name: Danielle Wong Birthdate: 10-Dec-1960 Sex: female Admission Date (Current Location): 10/19/2019  [facility] and [place] Number:  Producer, television/film/video and Address:  [facility] [address] [place], [place] [address]      Provider Number: [id number]  Attending Physician Name and Address:  Coralie Keens  Relative Name and Phone Number:       Current Level of Care: Hospital Recommended Level of Care: Skilled Nursing Facility Prior Approval Number:    Date Approved/Denied:   PASRR Number: Manual review  Discharge Plan: SNF    Current Diagnoses: Patient Active Problem List   Diagnosis Date Noted  . CVA (cerebral vascular accident) (HCC) 10/19/2019  . Cocaine abuse (HCC) 10/19/2019  . Benzodiazepine abuse (HCC) 10/19/2019  . Anxiety 10/19/2019  . Polysubstance abuse (HCC) 10/19/2019  . Bronchitis due to tobacco use 10/19/2019    Orientation RESPIRATION BLADDER Height & Weight     Self  O2 ([place] 2L) Continent Weight: 120 lb 5.9 oz (54.6 kg) Height:  5\' 5"  (165.1 cm)  BEHAVIORAL SYMPTOMS/MOOD NEUROLOGICAL BOWEL NUTRITION STATUS      Continent Diet (see DC summary)  AMBULATORY STATUS COMMUNICATION OF NEEDS Skin   Limited Assist Verbally Skin abrasions                       Personal Care Assistance Level of Assistance  Bathing, Feeding, Dressing Bathing Assistance: Limited assistance Feeding assistance: Limited assistance Dressing Assistance: Limited assistance     Functional Limitations Info             SPECIAL CARE FACTORS FREQUENCY  PT (By licensed PT), OT (By licensed OT)     PT Frequency: 5x/wk OT Frequency: 5x/wk            Contractures Contractures Info: Not present    Additional Factors Info  Code Status, Allergies, Psychotropic Code Status Info: Full Allergies Info: Topamax Psychotropic Info: Xanax 1mg  3x/day;  Cymbalta 60mg  2x/day; Seroquel 600mg  daily at bed         Current Medications (10/22/2019):  This is the current hospital active medication list Current Facility-Administered Medications  Medication Dose Route Frequency Provider Last Rate Last Admin  .  stroke: mapping our early stages of recovery book   Does not apply Once , MD      . acetaminophen (TYLENOL) tablet 650 mg  650 mg Oral Q4H PRN , MD   650 mg at 10/21/19 0959  . albuterol (PROVENTIL) (2.5 MG/3ML) 0.083% nebulizer solution 3 mL  3 mL Inhalation Q6H PRN Arrien, 10/24/2019, MD      . ALPRAZolam Rometta Emery) tablet 1 mg  1 mg Oral TID Rometta Emery, MD   1 mg at 10/22/19 1635  . amLODipine (NORVASC) tablet 5 mg  5 mg Oral Daily Arrien, York Ram, MD   5 mg at 10/22/19 1635  . aspirin EC tablet 81 mg  81 mg Oral Daily Coralie Keens, MD   81 mg at 10/22/19 0905  . budesonide (PULMICORT) nebulizer solution 0.25 mg  0.25 mg Nebulization BID Arrien, York Ram, MD      . clopidogrel (PLAVIX) tablet 75 mg  75 mg Oral Daily 10/24/19, MD   75 mg at 10/22/19 0906  . DULoxetine (CYMBALTA) DR capsule 60 mg  60 mg Oral BID Arrien, 10/24/19, MD      .  folic acid (FOLVITE) tablet 1 mg  1 mg Oral Daily Rolly Salter, MD   1 mg at 10/22/19 0909  . [START ON 10/23/2019] linaclotide (LINZESS) capsule 72 mcg  72 mcg Oral QAC breakfast Arrien, York Ram, MD      . metoCLOPramide [facility]) injection 10 mg  10 mg Intravenous Q6H Rolly Salter, MD   10 mg at 10/22/19 1242  . morphine 2 MG/ML injection 2 mg  2 mg Intravenous Q3H PRN Rolly Salter, MD   2 mg at 10/22/19 0511  . multivitamin with minerals tablet 1 tablet  1 tablet Oral Daily Rolly Salter, MD   1 tablet at 10/22/19 0906  . nicotine (NICODERM CQ - dosed in mg/24 hours) patch 21 mg  21 mg Transdermal Daily Earlie Lou L, MD   21 mg at 10/20/19 1359  . promethazine (PHENERGAN) injection 12.5 mg  12.5 mg  Intravenous Q6H PRN Rolly Salter, MD   12.5 mg at 10/22/19 0511  . QUEtiapine (SEROQUEL) tablet 600 mg  600 mg Oral QHS Arrien, York Ram, MD      . senna-docusate (Senokot-S) tablet 1 tablet  1 tablet Oral BID Rolly Salter, MD   1 tablet at 10/22/19 0905  . thiamine tablet 100 mg  100 mg Oral Daily Rolly Salter, MD   100 mg at 10/22/19 1610     Discharge Medications: Please see discharge summary for a list of discharge medications.  Relevant Imaging Results:  Relevant Lab Results:   Additional Information SS#: [id number]  Baldemar Lenis, LCSW

## 2019-10-22 NOTE — Progress Notes (Addendum)
0030 Pt still sleeping, easy to arouse, NAD, WCTM. Fall precautions in place.  0230 Lab in room, pt easy to arouse, NAD. WCTM.   0500 RN heard pt dry heaving. Medicated per [facility] for nausea and pain. Assisted pt to [facility]. WCTM.   0645 Pt sleeping comfortably, NAD. WCTM.

## 2019-10-22 NOTE — Progress Notes (Signed)
CSW left voicemail for patient's daughter to discuss discharge plans. Awaiting call back.  Blenda Nicely, [place] Clinical Social Worker [phone]

## 2019-10-22 NOTE — Progress Notes (Addendum)
PROGRESS NOTE    Danielle Wong  QAS:[id number] DOB: 12-27-1960 DOA: 10/19/2019 PCP: Danielle Wong    Brief Narrative:  Patient admitted to the hospital with working diagnosis of acute multifocal bilateral cerebral infarcts likely embolic in nature, cocaine associated vasculitis  59 year old female with past medical history of polysubstance abuse, COPD, hypertension, anxiety disorder, migraines who presents with altered mental status.  Her daughter was unable to reach her, EMS was called, she was found sitting in a chair confused and disoriented.  She was brought to the hospital for further evaluation.  Her temperature was 95.1, blood pressure 155/109, heart rate 107, respiratory 22, oxygen saturation 79% on room air, up to 100% on 2 L of supplemental oxygen.  She had dry mucous membranes, her lungs are clear to auscultation bilaterally, heart S1-S2, present rhythmic, tachycardic, soft abdomen, she was very anxious and agitated, tremulous, no focal deficit. Sodium 141, potassium 4.6, chloride 102, bicarb 24, glucose 98, BUN 33, creatinine 1.37, AST 70, ALT 49, white count 14.6, hemoglobin 13.8, hematocrit 45.1, platelets 192.  SARS COVID-19 was negative.  Urinalysis specific gravity 1.013.  Positive for benzodiazepines and cocaine.  Alcohol less than 10. Head CT with symmetric decreased attenuation in each posterior mid cerebellar hemisphere.  Suspect edema in the superior lateral aspect of the left parietal lobe.  CT cervical spine no acute changes. Chest x-ray with no acute changes.  EKG 86 bpm, normal axis, normal intervals, sinus rhythm, poor R wave progression, concave ST elevation in lead II,III, aVF, no T wave abnormalities, repolarization changes likely due to LVH.  CT angiography with evolving acute infarctions  Brain MRI with multifocal acute infarcts involving the left parietal lobe, left centrum semiovale, bilateral globi pallidi, left hippocampus, right occipital lobe and bilateral   cerebellar hemispheres. Echocardiogram with preserved LV systolic function, with no sings of intracardiac thrombus.  Patient has been placed on dual antiplatelet therapy with aspirin and clopidogrel, statin therapy with atorvastatin.  Physical therapy and occupational therapy have recommended skilled nursing facility  Assessment & Plan:   Principal Problem:   CVA (cerebral vascular accident) Danielle Wong) Active Problems:   Cocaine abuse (HCC)   Benzodiazepine abuse (HCC)   Anxiety   Polysubstance abuse (HCC)   Bronchitis due to tobacco use    1. Acute multifocal infarcts involving the left parietal lobe, left centrum semiovale, bilateral globi pallidi, left hippocampus, right occipital lobe and bilateral cerebellar hemispheres. / cocaine induced vasculitis/ embolic.  Patient continue to have right upper extremity weakness, poor balance and memory impairment.   Continue dual antiplatelet therapy with aspirin and clopidogrel, continue for 3 weeks, then continue with aspirin alone.  Continue with statin therapy.   Patient will need SNF to continue recovery.   2. HTN/ dyslipidemia. Blood pressure today is 145/99 mmHg. Will resume amlodipine 5 mg daily for blood pressure control.   3. AKI./ hypokalemia. Renal function with serum cr at 0,58. Serum K is down to 2,9 with serum bicarbonate at 29.  Will add 80 meq of Kcl in 2 divided doses and will follow up renal panel in am. Check Mg and electrolytes.   4. Substance abuse/ anxiety/ tobacco abuse/ toxic and metabolic encephalopathy. No clinical signs of withdrawal, will continue with neuro checks per unit protocol.   Will hold on benzodiazepines for now, she has not required any dose over last 24 H.  Continue with thiamine and quetiapine (resume her home dose of 600 mg qhs) On multivitamins. Resume duloxetine.  Continue with  morphine as needed for pain. Will resume tid alprazolam per her home regimen.   5. Reactive leukocytosis/  macrocytosis. Wbc is 8,1 and hgb 14,2/ follow as outpatient .   6. Intractable nausea and vomiting.  Clinically resolved, patient now tolerating po well. Continue with as needed metoclopramide and phenergan.   7. COPD/ chronic bronchitis. Continue with as needed albuterol and resume inhaled corticosteroids.   Status is: Inpatient  Remains inpatient appropriate because:Inpatient level of care appropriate due to severity of illness   Dispo: The patient is from: Home              Anticipated d/c is to: SNF              Anticipated d/c date is: 1 day              Patient currently is not medically stable to d/c.   DVT prophylaxis: Enoxaparin   Code Status:   full  Family Communication:  I spoke over the phone with the patient's daughter Danielle Wong about patient's  condition, plan of care, prognosis and all questions were addressed.       Consultants:   Neurology     Subjective: Patient continue to have weakness, right upper extremity and difficulty ambulating, no nausea or vomiting, no dyspnea or chest pain.   Objective: Vitals:   10/22/19 0510 10/22/19 0800 10/22/19 0813 10/22/19 1158  BP: (!) 144/92  (!) 142/92 (!) 145/99  Pulse: 94  95 (!) 102  Resp:  20  18  Temp: 97.6 F (36.4 C)  97.8 F (36.6 C) 98.6 F (37 C)  TempSrc: Oral  Oral Oral  SpO2: 94%  96% 94%  Weight:      Height:        Intake/Output Summary (Last 24 hours) at 10/22/2019 1406 Last data filed at 10/22/2019 1000 Gross per 24 hour  Intake 290 ml  Output --  Net 290 ml   Filed Weights   10/19/19 1248 10/20/19 0646 10/20/19 2055  Weight: 54.4 kg 54.4 kg 54.6 kg    Examination:   General: Not in pain or dyspnea. Deconditioned  Neurology: Awake and alert, right upper extremity weakness.  E ENT: mild pallor, no icterus, oral mucosa moist Cardiovascular: No JVD. S1-S2 present, rhythmic, no gallops, rubs, or murmurs. No lower extremity edema. Pulmonary: positive breath sounds bilaterally, adequate  air movement, no wheezing, rhonchi or rales. Gastrointestinal. Abdomen soft and non tender  Skin. No rashes Musculoskeletal: no joint deformities     Data Reviewed: I have personally reviewed following labs and imaging studies  CBC: Recent Labs  Lab 10/19/19 1358 10/21/19 0310 10/22/19 0237  WBC 14.6* 10.0 8.1  HGB 13.8 14.0 14.2  HCT 45.1 43.4 43.7  MCV 102.5* 98.9 95.4  PLT 192 142* 158   Basic Metabolic Panel: Recent Labs  Lab 10/19/19 1358 10/20/19 1049 10/21/19 0310 10/22/19 0237  NA 141  --  139 141  K 4.6  --  3.7 2.9*  CL 102  --  103 103  CO2 24  --  25 29  GLUCOSE 98  --  75 96  BUN 33*  --  11 6  CREATININE 1.37* 0.83 0.67 0.58  CALCIUM 9.0  --  8.8* 9.0   GFR: Estimated Creatinine Clearance: 65.3 mL/min (by C-G formula based on SCr of 0.58 mg/dL). Liver Function Tests: Recent Labs  Lab 10/19/19 1358  AST 70*  ALT 49*  ALKPHOS 75  BILITOT 0.4  PROT 7.0  ALBUMIN 4.2   No results for input(s): LIPASE, AMYLASE in the last 168 hours. No results for input(s): AMMONIA in the last 168 hours. Coagulation Profile: No results for input(s): INR, PROTIME in the last 168 hours. Cardiac Enzymes: No results for input(s): CKTOTAL, CKMB, CKMBINDEX, TROPONINI in the last 168 hours. BNP (last 3 results) No results for input(s): PROBNP in the last 8760 hours. HbA1C: Recent Labs    10/20/19 1049  HGBA1C 6.3*   CBG: Recent Labs  Lab 10/19/19 1503  GLUCAP 85   Lipid Profile: Recent Labs    10/20/19 1049  CHOL 218*  HDL 71  LDLCALC 132*  TRIG 77  CHOLHDL 3.1   Thyroid Function Tests: No results for input(s): TSH, T4TOTAL, FREET4, T3FREE, THYROIDAB in the last 72 hours. Anemia Panel: Recent Labs    10/22/19 0237  VITAMINB12 507  FOLATE 10.7      Radiology Studies: I have reviewed all of the imaging during this hospital visit personally     Scheduled Meds: .  stroke: mapping our early stages of recovery book   Does not apply Once    . aspirin EC  81 mg Oral Daily  . clopidogrel  75 mg Oral Daily  . [START ON 10/23/2019] enoxaparin (LOVENOX) injection  40 mg Subcutaneous Q24H  . folic acid  1 mg Oral Daily  . LORazepam  0-4 mg Intravenous Q6H   Followed by  . [START ON 10/23/2019] LORazepam  0-4 mg Intravenous Q12H  . metoCLOPramide (REGLAN) injection  10 mg Intravenous Q6H  . multivitamin with minerals  1 tablet Oral Daily  . nicotine  21 mg Transdermal Daily  . QUEtiapine  200 mg Oral QHS  . senna-docusate  1 tablet Oral BID  . thiamine  100 mg Oral Daily   Or  . thiamine  100 mg Intravenous Daily   Continuous Infusions:   LOS: 3 days        Gurnoor Sloop Annett Gula, MD

## 2019-10-23 LAB — BASIC METABOLIC PANEL
Anion gap: 8 (ref 5–15)
BUN: 7 mg/dL (ref 6–20)
CO2: 28 mmol/L (ref 22–32)
Calcium: 9.3 mg/dL (ref 8.9–10.3)
Chloride: 104 mmol/L (ref 98–111)
Creatinine, Ser: 0.6 mg/dL (ref 0.44–1.00)
GFR calc Af Amer: >60 mL/min (ref >60)
GFR calc non Af Amer: >60 mL/min (ref >60)
Glucose, Bld: 116 mg/dL — ABNORMAL HIGH (ref 70–99)
Potassium: 3.6 mmol/L (ref 3.5–5.1)
Sodium: 140 mmol/L (ref 135–145)

## 2019-10-23 LAB — MAGNESIUM: Magnesium: 2 mg/dL (ref 1.7–2.4)

## 2019-10-23 MED ORDER — ATORVASTATIN CALCIUM 40 MG PO TABS
40.0000 mg | ORAL_TABLET | Freq: Every day | ORAL | Status: DC
  Administered 2019-10-23: 40 mg via ORAL
  Filled 2019-10-23 (×2): qty 1

## 2019-10-23 MED ORDER — ATORVASTATIN CALCIUM 40 MG PO TABS: 40.0000 mg | ORAL_TABLET | Freq: Every day | ORAL | 0 refills | Status: AC

## 2019-10-23 MED ORDER — POTASSIUM CHLORIDE CRYS ER 20 MEQ PO TBCR
40.0000 meq | EXTENDED_RELEASE_TABLET | Freq: Once | ORAL | Status: AC
  Administered 2019-10-23: 40 meq via ORAL
  Filled 2019-10-23: qty 2

## 2019-10-23 MED ORDER — ALPRAZOLAM 1 MG PO TABS: 1.0000 mg | ORAL_TABLET | Freq: Three times a day (TID) | ORAL | 0 refills | Status: AC

## 2019-10-23 MED ORDER — ASPIRIN 81 MG PO TBEC: 81.0000 mg | DELAYED_RELEASE_TABLET | Freq: Every day | ORAL | 11 refills | Status: AC

## 2019-10-23 MED ORDER — CLOPIDOGREL BISULFATE 75 MG PO TABS: 75.0000 mg | ORAL_TABLET | Freq: Every day | ORAL | 0 refills | Status: AC

## 2019-10-23 MED FILL — ALPRAZOLAM 1 MG TABS: 1 | 3 days supply | Qty: 10 | Fill #0

## 2019-10-23 NOTE — Discharge Summary (Addendum)
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 velocity: 81.00 cm/s  Systemic Diam: 2.10 cm MV A velocity: 51.40 cm/s MV E/A ratio:  1.58 Lennie Odor MD Electronically signed by Lennie Odor MD Signature Date/Time: 10/20/2019/12:56:26 PM    Final    CT ANGIO HEAD CODE STROKE  Result Date: 10/20/2019 CLINICAL DATA:  Stroke, follow-up EXAM: CT ANGIOGRAPHY HEAD AND NECK TECHNIQUE: Multidetector CT imaging of the head and neck was performed using the standard protocol during bolus administration of intravenous contrast. Multiplanar CT image reconstructions and MIPs were obtained to evaluate the vascular anatomy. Carotid stenosis measurements (when applicable) are obtained utilizing NASCET criteria, using the distal internal carotid diameter as the denominator. 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 There is new hypoattenuation along the left hippocampus, in the peripheral right occipital lobe, and at the right globus pallidus corresponding to acute infarcts on MRI. Few additional small infarcts are better seen on the MRI. There  is no acute intracranial hemorrhage or significant mass effect. Ventricles are normal in size. Vascular: No new finding. Skull: Calvarium is unremarkable. Sinuses/Orbits: No acute finding. Other: None. Review of the MIP images confirms the above findings CTA NECK Aortic arch: Great vessel origins are patent Right carotid system: Patent. Mixed plaque at the ICA origin causing minimal stenosis. Left carotid system: Patent. Mixed plaque at the ICA origin causing minimal stenosis. Vertebral arteries: Patent.  Right vertebral artery is dominant. Skeleton: Degenerative changes of the cervical spine. Other neck: No mass or adenopathy. Upper chest: No apical lung mass. Review of the MIP images confirms the above findings CTA HEAD Anterior circulation: Intracranial internal carotid arteries are patent with mild calcified plaque. Anterior and middle cerebral arteries are patent. Posterior circulation: Intracranial vertebral arteries, basilar artery, and posterior cerebral arteries are patent. Bilateral posterior communicating arteries are present. Venous sinuses: Patent as allowed by contrast bolus timing. Review of the MIP images confirms the above findings IMPRESSION: Evolving acute infarctions more completely seen on prior MRI. No acute intracranial hemorrhage. No large vessel occlusion or hemodynamically significant stenosis. Electronically Signed   By: Guadlupe Spanish M.D.   On: 10/20/2019 12:15   CT ANGIO NECK CODE STROKE  Result Date: 10/20/2019 CLINICAL DATA:  Stroke, follow-up EXAM: CT ANGIOGRAPHY HEAD AND NECK TECHNIQUE: Multidetector CT imaging of the head and neck was performed using the standard protocol during bolus administration of intravenous contrast. Multiplanar CT image reconstructions and MIPs were obtained to evaluate the vascular anatomy. Carotid stenosis measurements (when applicable) are obtained utilizing NASCET criteria, using the distal internal carotid diameter as the denominator. 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 There is new hypoattenuation along the left hippocampus, in the peripheral right occipital lobe, and at the right globus pallidus corresponding to acute infarcts on MRI. Few additional small infarcts are better seen on the MRI. There is no acute intracranial hemorrhage or significant mass effect. Ventricles are normal in size. Vascular: No new finding. Skull: Calvarium is unremarkable. Sinuses/Orbits: No acute finding. Other: None. Review of the MIP images confirms the above findings CTA NECK Aortic arch: Great vessel origins are patent Right carotid system: Patent. Mixed plaque at the ICA origin causing minimal stenosis. Left carotid system: Patent. Mixed plaque at the ICA origin causing minimal stenosis. Vertebral arteries: Patent.  Right vertebral artery is dominant. Skeleton: Degenerative changes of the cervical spine. Other neck: No mass or adenopathy. Upper chest: No apical lung mass. Review of the MIP images confirms the above findings CTA HEAD Anterior circulation: Intracranial internal carotid arteries are patent with mild calcified plaque. Anterior and middle cerebral arteries are patent. Posterior circulation: Intracranial vertebral arteries, basilar artery, and posterior cerebral arteries are patent. Bilateral posterior communicating arteries are present. Venous sinuses: Patent as allowed by contrast bolus timing. Review of the MIP images confirms the above findings IMPRESSION: Evolving acute infarctions more completely seen on prior MRI. No acute intracranial hemorrhage. No large vessel occlusion or hemodynamically significant stenosis. Electronically Signed   By: Guadlupe Spanish M.D.   On: 10/20/2019 12:15        Subjective: Patient is feeling better, her  right upper extremity weakness is improving, no nausea or vomiting, no chest pain or dyspnea.   Discharge Exam: Vitals:   10/23/19 0731 10/23/19 0830  BP:  130/82  Pulse: (!) 104 98  Resp: (!) 23 17  Temp:  98 F (36.7 C)  SpO2: 94%    Vitals:   10/23/19 0002 10/23/19 0352 10/23/19 0731 10/23/19 0830  BP: 137/85 109/77  130/82  Pulse: 99 93 (!) 104 98  Resp: 18 20 (!) 23 17  Temp: 98 F (36.7 C) 98 F (36.7 C)  98 F (36.7 C)  TempSrc: Oral  SpO2: 97% 93% 94%   Weight:      Height:        General: not in pain or dyspnea.  Neurology: Awake and alert, right hand grip is 4/5 left 5/5, lower extremities bilaterally 5/5.  E ENT: no pallor, no icterus, oral mucosa moist Cardiovascular: No JVD. S1-S2 present, rhythmic, no gallops, rubs, or murmurs. No lower extremity edema. Pulmonary: positive breath sounds bilaterally,  Gastrointestinal. Abdomen soft and non tender Skin. No rashes Musculoskeletal: no joint deformities   The results of significant diagnostics from this hospitalization (including imaging, microbiology, ancillary and laboratory) are listed below for reference.     Microbiology: Recent Results (from the past 240 hour(s))  SARS Coronavirus 2 by RT PCR (hospital order, performed in [facility] hospital lab) Nasopharyngeal Nasopharyngeal Swab     Status: None   Collection Time: 10/19/19  4:02 PM   Specimen: Nasopharyngeal Swab  Result Value Ref Range Status   SARS Coronavirus 2 NEGATIVE NEGATIVE Final    Comment: (NOTE) SARS-CoV-2 target nucleic acids are NOT DETECTED.  The SARS-CoV-2 RNA is generally detectable in upper and lower respiratory specimens during the acute phase of infection. The lowest concentration of SARS-CoV-2 viral copies this assay can detect is 250 copies / mL. A negative result does not preclude SARS-CoV-2 infection and should not be used as the sole basis for treatment or other patient management decisions.  A negative result may  occur with improper specimen collection / handling, submission of specimen other than nasopharyngeal swab, presence of viral mutation(s) within the areas targeted by this assay, and inadequate number of viral copies (<250 copies / mL). A negative result must be combined with clinical observations, patient history, and epidemiological information.  Fact Sheet for Patients:   BoilerBrush.com.cy  Fact Sheet for Healthcare Providers: https://pope.com/  This test is not yet approved or  cleared by the [place] FDA and has been authorized for detection and/or diagnosis of SARS-CoV-2 by FDA under an Emergency Use Authorization (EUA).  This EUA will remain in effect (meaning this test can be used) for the duration of the COVID-19 declaration under Section 564(b)(1) of the Act, 21 U.S.C. section 360bbb-3(b)(1), unless the authorization is terminated or revoked sooner.  Performed at [facility], [address], [place], [place] [address]      Labs: BNP (last 3 results) Recent Labs    05/19/19 1643  BNP 34.4   Basic Metabolic Panel: Recent Labs  Lab 10/19/19 1358 10/20/19 1049 10/21/19 0310 10/22/19 0237 10/23/19 0142  NA 141  --  139 141 140  K 4.6  --  3.7 2.9* 3.6  CL 102  --  103 103 104  CO2 24  --  25 29 28   GLUCOSE 98  --  75 96 116*  BUN 33*  --  11 6 7   CREATININE 1.37* 0.83 0.67 0.58 0.60  CALCIUM 9.0  --  8.8* 9.0 9.3  MG  --   --   --   --  2.0   Liver Function Tests: Recent Labs  Lab 10/19/19 1358  AST 70*  ALT 49*  ALKPHOS 75  BILITOT 0.4  PROT 7.0  ALBUMIN 4.2   No results for input(s): LIPASE, AMYLASE in the last 168 hours. No results for input(s): AMMONIA in the last 168 hours. CBC: Recent Labs  Lab 10/19/19 1358 10/21/19 0310 10/22/19 0237  WBC 14.6* 10.0 8.1  HGB 13.8 14.0 14.2  HCT 45.1 43.4 43.7  MCV  102.5* 98.9 95.4  PLT 192 142* 158   Cardiac Enzymes: No results for  input(s): CKTOTAL, CKMB, CKMBINDEX, TROPONINI in the last 168 hours. BNP: Invalid input(s): POCBNP CBG: Recent Labs  Lab 10/19/19 1503  GLUCAP 85   D-Dimer No results for input(s): DDIMER in the last 72 hours. Hgb A1c Recent Labs    10/20/19 1049  HGBA1C 6.3*   Lipid Profile Recent Labs    10/20/19 1049  CHOL 218*  HDL 71  LDLCALC 132*  TRIG 77  CHOLHDL 3.1   Thyroid function studies No results for input(s): TSH, T4TOTAL, T3FREE, THYROIDAB in the last 72 hours.  Invalid input(s): FREET3 Anemia work up Recent Labs    10/22/19 0237  VITAMINB12 507  FOLATE 10.7   Urinalysis    Component Value Date/Time   COLORURINE YELLOW 10/19/2019 2009   APPEARANCEUR CLEAR 10/19/2019 2009   LABSPEC 1.013 10/19/2019 2009   PHURINE 5.0 10/19/2019 2009   GLUCOSEU NEGATIVE 10/19/2019 2009   HGBUR SMALL (A) 10/19/2019 2009   BILIRUBINUR NEGATIVE 10/19/2019 2009   KETONESUR 5 (A) 10/19/2019 2009   PROTEINUR NEGATIVE 10/19/2019 2009   NITRITE NEGATIVE 10/19/2019 2009   LEUKOCYTESUR NEGATIVE 10/19/2019 2009   Sepsis Labs Invalid input(s): PROCALCITONIN,  WBC,  LACTICIDVEN Microbiology Recent Results (from the past 240 hour(s))  SARS Coronavirus 2 by RT PCR (hospital order, performed in [facility] hospital lab) Nasopharyngeal Nasopharyngeal Swab     Status: None   Collection Time: 10/19/19  4:02 PM   Specimen: Nasopharyngeal Swab  Result Value Ref Range Status   SARS Coronavirus 2 NEGATIVE NEGATIVE Final    Comment: (NOTE) SARS-CoV-2 target nucleic acids are NOT DETECTED.  The SARS-CoV-2 RNA is generally detectable in upper and lower respiratory specimens during the acute phase of infection. The lowest concentration of SARS-CoV-2 viral copies this assay can detect is 250 copies / mL. A negative result does not preclude SARS-CoV-2 infection and should not be used as the sole basis for treatment or other patient management decisions.  A negative result may occur  with improper specimen collection / handling, submission of specimen other than nasopharyngeal swab, presence of viral mutation(s) within the areas targeted by this assay, and inadequate number of viral copies (<250 copies / mL). A negative result must be combined with clinical observations, patient history, and epidemiological information.  Fact Sheet for Patients:   BoilerBrush.com.cy  Fact Sheet for Healthcare Providers: https://pope.com/  This test is not yet approved or  cleared by the [place] FDA and has been authorized for detection and/or diagnosis of SARS-CoV-2 by FDA under an Emergency Use Authorization (EUA).  This EUA will remain in effect (meaning this test can be used) for the duration of the COVID-19 declaration under Section 564(b)(1) of the Act, 21 U.S.C. section 360bbb-3(b)(1), unless the authorization is terminated or revoked sooner.  Performed at [facility], [address] [place], [place] [address]      Time coordinating discharge: 45 minutes  SIGNED:   Coralie Keens, MD  [facility] 10/23/2019, 9:48 AM
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
## 2019-10-23 NOTE — SANE Note (Signed)
The SANE/FNE Teacher, music) consult has been completed. Dr. Carolyne Littles, RN, and CSW have been notified. Please contact the SANE/FNE nurse on call (listed in Amion) with any further concerns.  Patient declining all services at this time. Provided SANE brochure/business card and [facility] brochure.
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
## 2019-10-23 NOTE — Progress Notes (Signed)
Occupational Therapy Treatment Patient Details Name: Danielle Wong MRN: [id number] DOB: 06-16-1960 Today's Date: 10/23/2019    History of present illness 59 y.o. female admitted on 10/19/19 for AMS.  Toxicology screen (+) for benzos and cocaine.  Head CT negative, however MRI revealed multifocal acute infarcts involving the L parietal lobe, L centrum semiovale, bil globi pallidi, L hippocampus, R occipital lobe, and bil cerebellar hemispheres.  Pt with significant PMH of COPD, HTN, anxiety, migraines.     OT comments  Patient supine in bed and agreeable to OT session.  Patient continues to demonstrate decreased awareness to situation, disoriented to time (2010, July or august).  Short blessed test reveals impairment consistent with dementia, scoring 13/28 and further assessment required. Overall cognitively demonstrates decreased STM, problem solving, attention, awareness and safety. Able to complete 3 step trail making task with min- mod cueing to recall 2/3 tasks, but able to locate items in hallway with min cueing only. Max drop rate with R hand during session ADL tasks, easily frustrated and poor awareness with dropping items.  Discussed safety, recommendations and need for 24/7 supervision for safety; pt reports she does not have this. She will need assist at discharge for IADLs, including medicine management. Continue to recommend SNF.    Follow Up Recommendations  SNF;Supervision/Assistance - 24 hour    Equipment Recommendations  3 in 1 bedside commode    Recommendations for Other Services      Precautions / Restrictions Precautions Precautions: Other (comment);Fall Precaution Comments: R inattention Restrictions Weight Bearing Restrictions: No       Mobility Bed Mobility Overal bed mobility: Modified Independent                Transfers Overall transfer level: Needs assistance   Transfers: Sit to/from Stand Sit to Stand: Min guard         General transfer comment:  for safety     Balance Overall balance assessment: Needs assistance Sitting-balance support: No upper extremity supported Sitting balance-Leahy Scale: Good     Standing balance support: No upper extremity supported;During functional activity Standing balance-Leahy Scale: Fair                             ADL either performed or assessed with clinical judgement   ADL Overall ADL's : Needs assistance/impaired     Grooming: Minimal assistance;Standing Grooming Details (indicate cue type and reason): min assist to manage items in R hand, educated on use of compensatory techniques with poor carryover; max drop rate of R hand                  Toilet Transfer: Min guard;Ambulation Toilet Transfer Details (indicate cue type and reason): simulated in room          Functional mobility during ADLs: Min guard General ADL Comments: pt remains limited by R inattention, decreased coordination/sensation, cognition, and safety      Vision   Additional Comments: pt reports wearing glasses, but denies visual changes; able to locate items at sink with increased time; scanning in hallway to locate items with min cueing only   Perception     Praxis      Cognition Arousal/Alertness: Awake/alert Behavior During Therapy: WFL for tasks assessed/performed Overall Cognitive Status: Impaired/Different from baseline Area of Impairment: Orientation;Memory;Attention;Following commands;Safety/judgement;Awareness;Problem solving                 Orientation Level: Disoriented to;Situation;Time Current Attention Level: Selective Memory:  Decreased recall of precautions;Decreased short-term memory Following Commands: Follows one step commands consistently;Follows multi-step commands inconsistently Safety/Judgement: Decreased awareness of safety;Decreased awareness of deficits Awareness: Emergent Problem Solving: Slow processing;Requires verbal cues General Comments: patient  continues to be disoriented to time (2010), situation; poor awareness of safety, short blessed test reveals 13/26 (impairement consistent with dementia); engaged in 3 step trail making task requiring moderate cueing to recall at tasks        Exercises     Shoulder Instructions       General Comments on RA upon entry, Sp02 85%; donned 2L with Spo2 upto 90%    Pertinent Vitals/ Pain       Pain Assessment: No/denies pain  Home Living                                          Prior Functioning/Environment              Frequency  Min 2X/week        Progress Toward Goals  OT Goals(current goals can now be found in the care plan section)  Progress towards OT goals: Progressing toward goals  Acute Rehab OT Goals Patient Stated Goal: home OT Goal Formulation: With patient  Plan Discharge plan remains appropriate;Frequency remains appropriate    Co-evaluation                 AM-PAC OT "6 Clicks" Daily Activity     Outcome Measure   Help from another person eating meals?: A Little Help from another person taking care of personal grooming?: A Little Help from another person toileting, which includes using toliet, bedpan, or urinal?: A Little Help from another person bathing (including washing, rinsing, drying)?: A Little Help from another person to put on and taking off regular upper body clothing?: A Lot Help from another person to put on and taking off regular lower body clothing?: A Little 6 Click Score: 17    End of Session Equipment Utilized During Treatment: Gait belt;Oxygen  OT Visit Diagnosis: Unsteadiness on feet (R26.81);Muscle weakness (generalized) (M62.81);Apraxia (R48.2);Other symptoms and signs involving cognitive function;Pain   Activity Tolerance Patient tolerated treatment well   Patient Left Other (comment) (with PT)   Nurse Communication Mobility status        Time: 7893-8101 OT Time Calculation (min): 28  min  Charges: OT General Charges $OT Visit: 1 Visit OT Treatments $Self Care/Home Management : 23-37 mins  Barry Brunner, OT Acute Rehabilitation Services Pager [phone] Office [phone]    Chancy Milroy 10/23/2019, 1:11 PM
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
## 2019-10-23 NOTE — Progress Notes (Signed)
Patient being discharged home. Education and information provided to pat. Iv's removed. Tele removed. CCMD notified. All belongings with patient. Meds from [facility] given to patient. O2 provided to home use. Leaving unit via wheelchair and on oxygen.

## 2019-12-09 ENCOUNTER — Other Ambulatory Visit: Payer: Self-pay

## 2019-12-09 ENCOUNTER — Emergency Department (HOSPITAL_COMMUNITY)
Admission: EM | Admit: 2019-12-09 | Discharge: 2019-12-10 | Disposition: A | Discharge status: Home / Self Care | Payer: Medicaid Other | Attending: Emergency Medicine | Admitting: Emergency Medicine

## 2019-12-09 DIAGNOSIS — T50901A Poisoning by unspecified drugs, medicaments and biological substances, accidental (unintentional), initial encounter: Secondary | ICD-10-CM | POA: Diagnosis present

## 2019-12-09 DIAGNOSIS — F17210 Nicotine dependence, cigarettes, uncomplicated: Secondary | ICD-10-CM | POA: Insufficient documentation

## 2019-12-09 DIAGNOSIS — Z7982 Long term (current) use of aspirin: Secondary | ICD-10-CM | POA: Insufficient documentation

## 2019-12-10 ENCOUNTER — Encounter (HOSPITAL_COMMUNITY): Payer: Self-pay

## 2019-12-10 LAB — CBC WITH DIFFERENTIAL/PLATELET
Abs Immature Granulocytes: 0.13 10*3/uL — ABNORMAL HIGH (ref 0.00–0.07)
Basophils Absolute: 0.1 10*3/uL (ref 0.0–0.1)
Basophils Relative: 1 %
Eosinophils Absolute: 0.1 10*3/uL (ref 0.0–0.5)
Eosinophils Relative: 1 %
HCT: 41.3 % (ref 36.0–46.0)
Hemoglobin: 13.2 g/dL (ref 12.0–15.0)
Immature Granulocytes: 1 %
Lymphocytes Relative: 12 %
Lymphs Abs: 1.2 10*3/uL (ref 0.7–4.0)
MCH: 31.9 pg (ref 26.0–34.0)
MCHC: 32 g/dL (ref 30.0–36.0)
MCV: 99.8 fL (ref 80.0–100.0)
Monocytes Absolute: 0.7 10*3/uL (ref 0.1–1.0)
Monocytes Relative: 8 %
Neutro Abs: 7.3 10*3/uL (ref 1.7–7.7)
Neutrophils Relative %: 77 %
Platelets: 159 10*3/uL (ref 150–400)
RBC: 4.14 MIL/uL (ref 3.87–5.11)
RDW: 13.8 % (ref 11.5–15.5)
WBC: 9.5 10*3/uL (ref 4.0–10.5)
nRBC: 0 % (ref 0.0–0.2)

## 2019-12-10 LAB — COMPREHENSIVE METABOLIC PANEL
ALT: 23 U/L (ref 0–44)
AST: 23 U/L (ref 15–41)
Albumin: 3.9 g/dL (ref 3.5–5.0)
Alkaline Phosphatase: 53 U/L (ref 38–126)
Anion gap: 12 (ref 5–15)
BUN: 26 mg/dL — ABNORMAL HIGH (ref 6–20)
CO2: 26 mmol/L (ref 22–32)
Calcium: 9.1 mg/dL (ref 8.9–10.3)
Chloride: 98 mmol/L (ref 98–111)
Creatinine, Ser: 1 mg/dL (ref 0.44–1.00)
GFR calc Af Amer: >60 mL/min (ref >60)
GFR calc non Af Amer: >60 mL/min (ref >60)
Glucose, Bld: 209 mg/dL — ABNORMAL HIGH (ref 70–99)
Potassium: 3.8 mmol/L (ref 3.5–5.1)
Sodium: 136 mmol/L (ref 135–145)
Total Bilirubin: 0.6 mg/dL (ref 0.3–1.2)
Total Protein: 6.7 g/dL (ref 6.5–8.1)

## 2019-12-10 LAB — SALICYLATE LEVEL: Salicylate Lvl: <7 mg/dL — ABNORMAL LOW (ref 7.0–30.0)

## 2019-12-10 LAB — ACETAMINOPHEN LEVEL: Acetaminophen (Tylenol), Serum: <10 ug/mL — ABNORMAL LOW (ref 10–30)

## 2019-12-10 LAB — ETHANOL: Alcohol, Ethyl (B): <10 mg/dL (ref <10)

## 2019-12-10 LAB — CBG MONITORING, ED: Glucose-Capillary: 227 mg/dL — ABNORMAL HIGH (ref 70–99)

## 2019-12-10 MED ORDER — ACETAMINOPHEN 500 MG PO TABS
1000.0000 mg | ORAL_TABLET | Freq: Once | ORAL | Status: AC
  Administered 2019-12-10: 1000 mg via ORAL
  Filled 2019-12-10: qty 2
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
## 2019-12-10 NOTE — Discharge Instructions (Addendum)
Labs today looked ok. Refrain from using illicit drugs.  They are detrimental to your health. If you want help with rehab/detox, see attached documents. Follow-up with your primary care doctor. Return here for any new/acute changes.

## 2019-12-10 NOTE — ED Notes (Signed)
Patient has been placed on the cardiac monitor with automatic BP and pulse oximetry.  Will continue to monitor.

## 2019-12-10 NOTE — ED Notes (Signed)
Patient is waiting for PTAR 

## 2019-12-10 NOTE — ED Notes (Signed)
Patient ambulated to and from the bathroom, states that she forgot that she had the specimen container.

## 2020-04-06 ENCOUNTER — Other Ambulatory Visit: Payer: Self-pay

## 2020-04-06 ENCOUNTER — Emergency Department (HOSPITAL_COMMUNITY)
Admission: EM | Admit: 2020-04-06 | Discharge: 2020-04-06 | Disposition: A | Discharge status: Home / Self Care | Payer: Medicaid Other | Attending: Emergency Medicine | Admitting: Emergency Medicine

## 2020-04-06 ENCOUNTER — Emergency Department (HOSPITAL_COMMUNITY): Payer: Medicaid Other

## 2020-04-06 DIAGNOSIS — Z20822 Contact with and (suspected) exposure to covid-19: Secondary | ICD-10-CM | POA: Insufficient documentation

## 2020-04-06 DIAGNOSIS — F17210 Nicotine dependence, cigarettes, uncomplicated: Secondary | ICD-10-CM | POA: Insufficient documentation

## 2020-04-06 DIAGNOSIS — J441 Chronic obstructive pulmonary disease with (acute) exacerbation: Secondary | ICD-10-CM | POA: Diagnosis not present

## 2020-04-06 DIAGNOSIS — Z7982 Long term (current) use of aspirin: Secondary | ICD-10-CM | POA: Insufficient documentation

## 2020-04-06 DIAGNOSIS — R0602 Shortness of breath: Secondary | ICD-10-CM | POA: Diagnosis present

## 2020-04-06 LAB — BASIC METABOLIC PANEL
Anion gap: 9 (ref 5–15)
BUN: 12 mg/dL (ref 6–20)
CO2: 30 mmol/L (ref 22–32)
Calcium: 9.5 mg/dL (ref 8.9–10.3)
Chloride: 101 mmol/L (ref 98–111)
Creatinine, Ser: 0.71 mg/dL (ref 0.44–1.00)
GFR, Estimated: >60 mL/min (ref >60)
Glucose, Bld: 120 mg/dL — ABNORMAL HIGH (ref 70–99)
Potassium: 4.1 mmol/L (ref 3.5–5.1)
Sodium: 140 mmol/L (ref 135–145)

## 2020-04-06 LAB — CBC
HCT: 38.8 % (ref 36.0–46.0)
Hemoglobin: 12.6 g/dL (ref 12.0–15.0)
MCH: 32 pg (ref 26.0–34.0)
MCHC: 32.5 g/dL (ref 30.0–36.0)
MCV: 98.5 fL (ref 80.0–100.0)
Platelets: 211 10*3/uL (ref 150–400)
RBC: 3.94 MIL/uL (ref 3.87–5.11)
RDW: 12.8 % (ref 11.5–15.5)
WBC: 11.3 10*3/uL — ABNORMAL HIGH (ref 4.0–10.5)
nRBC: 0 % (ref 0.0–0.2)

## 2020-04-06 LAB — POC SARS CORONAVIRUS 2 AG -  ED: SARS Coronavirus 2 Ag: NEGATIVE

## 2020-04-06 LAB — BRAIN NATRIURETIC PEPTIDE: B Natriuretic Peptide: 33.1 pg/mL (ref 0.0–100.0)

## 2020-04-06 MED ORDER — PREDNISONE 20 MG PO TABS
60.0000 mg | ORAL_TABLET | Freq: Once | ORAL | Status: AC
  Administered 2020-04-06: 60 mg via ORAL
  Filled 2020-04-06: qty 3

## 2020-04-06 MED ORDER — PREDNISONE 10 MG PO TABS: ORAL_TABLET | ORAL | 0 refills | Status: AC

## 2020-04-06 MED ORDER — ALBUTEROL SULFATE HFA 108 (90 BASE) MCG/ACT IN AERS
2.0000 | INHALATION_SPRAY | Freq: Once | RESPIRATORY_TRACT | Status: AC
  Administered 2020-04-06: 2 via RESPIRATORY_TRACT
  Filled 2020-04-06: qty 6.7

## 2020-04-06 MED ORDER — IPRATROPIUM BROMIDE HFA 17 MCG/ACT IN AERS
2.0000 | INHALATION_SPRAY | Freq: Once | RESPIRATORY_TRACT | Status: AC
  Administered 2020-04-06: 2 via RESPIRATORY_TRACT
  Filled 2020-04-06: qty 12.9

## 2020-04-06 MED ORDER — ALBUTEROL SULFATE (2.5 MG/3ML) 0.083% IN NEBU
5.0000 mg | INHALATION_SOLUTION | Freq: Once | RESPIRATORY_TRACT | Status: AC
  Administered 2020-04-06: 5 mg via RESPIRATORY_TRACT
  Filled 2020-04-06: qty 6

## 2020-04-06 NOTE — ED Triage Notes (Addendum)
Pt BIBA from home-  Per EMS- Pt c/o worsening ShOB starting Friday.  89% on 2L [place] (her baseline).  Pt with obvious dyspnea with exertion. Diminished in all fields.  EMS increased O2 to 3L [place] and O2 sats increased to 95%. Denies fever.  Reports productive cough.  Pt unvaccinated, everyday smoker.   Pt reports feeling "hot, chills" but unsure of fever bc she has no thermometer.  Also reports that she visited her daughter and after leaving found out "they were all sick"

## 2020-04-06 NOTE — Discharge Instructions (Addendum)
Your Covid test today was negative.  Continue your breathing treatments.  Take the steroids as prescribed.  Follow-up with your doctor to be rechecked next week

## 2020-04-06 NOTE — ED Notes (Signed)
Discharge paperwork reviewed with pt, including prescription.  Pt verbalized understanding, requests wheelchair to exit to meet ride.

## 2020-04-06 NOTE — ED Provider Notes (Signed)
[facility]-EMERGENCY DEPT Provider Note   CSN: [id number] Arrival date & time: 04/06/20  8341     History Chief Complaint  Patient presents with  . Shortness of Breath    Danielle Wong is a 60 y.o. female.  HPI   Patient presents to the ED for evaluation of shortness of breath. Patient has history of emphysema COPD. She does continue to smoke. Patient has not been vaccinated for COVID. She said she saw family members and found out after they left that they were all sick. She was not told that they had covid. Patient normally has oxygen to use intermittently but since Friday she has noticed that she has had to use it more regularly. She has had increasing symptoms with activity. EMS noted that her oxygen saturations were in the high 80s. She has not taken her temperature. She has felt chilled. No leg swelling  Past Medical History:  Diagnosis Date  . Emphysema lung [facility])     Patient Active Problem List   Diagnosis Date Noted  . CVA (cerebral vascular accident) (HCC) 10/19/2019  . Cocaine abuse (HCC) 10/19/2019  . Benzodiazepine abuse (HCC) 10/19/2019  . Anxiety 10/19/2019  . Polysubstance abuse (HCC) 10/19/2019  . Bronchitis due to tobacco use 10/19/2019    No past surgical history on file.   OB History   No obstetric history on file.     No family history on file.  Social History   Tobacco Use  . Smoking status: Current Every Day Smoker    Packs/day: 2.00    Types: Cigarettes  . Smokeless tobacco: Never Used  Substance Use Topics  . Alcohol use: Yes  . Drug use: Yes    Home Medications Prior to Admission medications   Medication Sig Start Date End Date Taking? Authorizing Provider  predniSONE (DELTASONE) 10 MG tablet Take 6 tablets (60 mg total) by mouth daily with breakfast for 2 days, THEN 5 tablets (50 mg total) daily with breakfast for 2 days, THEN 4 tablets (40 mg total) daily with breakfast for 2 days, THEN 3 tablets (30 mg total)  daily with breakfast for 2 days, THEN 2 tablets (20 mg total) daily with breakfast for 2 days, THEN 1 tablet (10 mg total) daily with breakfast for 2 days. 04/06/20 04/18/20 Yes Linwood Dibbles, MD  albuterol (VENTOLIN HFA) 108 (90 Base) MCG/ACT inhaler Inhale 2 puffs into the lungs every 6 (six) hours as needed for wheezing or shortness of breath.    [provider]  ALPRAZolam Prudy Feeler) 1 MG tablet Take 1 tablet (1 mg total) by mouth 3 (three) times daily. 10/23/19   Arrien, York Ram, MD  amLODipine (NORVASC) 5 MG tablet Take 5 mg by mouth daily.    [provider]  aspirin EC 81 MG EC tablet Take 1 tablet (81 mg total) by mouth daily. Swallow whole. 10/24/19   Arrien, York Ram, MD  atorvastatin (LIPITOR) 40 MG tablet Take 1 tablet (40 mg total) by mouth daily. 10/23/19 12/10/19  Arrien, York Ram, MD  benzonatate (TESSALON) 100 MG capsule Take 1 capsule (100 mg total) by mouth 2 (two) times daily as needed for up to 30 doses for cough. Patient not taking: Reported on 12/10/2019 05/19/19   Terald Sleeper, MD  DULoxetine (CYMBALTA) 60 MG capsule Take 60 mg by mouth 2 (two) times daily.  02/15/19 03/06/20  [provider]  esomeprazole (NEXIUM) 40 MG capsule Take 40 mg by mouth daily.  01/17/19  [provider]  fluticasone (FLOVENT HFA) 110 MCG/ACT inhaler Inhale 2 puffs into the lungs 2 (two) times daily.  05/14/19   [provider]  linaclotide (LINZESS) 72 MCG capsule Take 72 mcg by mouth daily before breakfast.    [provider]  QUEtiapine (SEROQUEL) 200 MG tablet Take 200 mg by mouth at bedtime.     [provider]    Allergies    Topamax [topiramate]  Review of Systems   Review of Systems  All other systems reviewed and are negative.   Physical Exam Updated Vital Signs BP (!) 137/92   Pulse 87   Temp 98 F (36.7 C) (Oral)   Resp 15   Ht 1.651 m (5\' 5" )   Wt 54.4 kg   SpO2 95%   BMI 19.97 kg/m   Physical  Exam Vitals and nursing note reviewed.  Constitutional:      Appearance: She is well-developed and well-nourished. She is ill-appearing.  HENT:     Head: Normocephalic and atraumatic.     Right Ear: External ear normal.     Left Ear: External ear normal.  Eyes:     General: No scleral icterus.       Right eye: No discharge.        Left eye: No discharge.     Conjunctiva/sclera: Conjunctivae normal.  Neck:     Trachea: No tracheal deviation.  Cardiovascular:     Rate and Rhythm: Normal rate and regular rhythm.     Pulses: Intact distal pulses.  Pulmonary:     Effort: Pulmonary effort is normal. No respiratory distress.     Breath sounds: No stridor. Decreased breath sounds present. No rales.     Comments: Decreased breath sounds throughout, no increased effort Abdominal:     General: Bowel sounds are normal. There is no distension.     Palpations: Abdomen is soft.     Tenderness: There is no abdominal tenderness. There is no guarding or rebound.  Musculoskeletal:        General: No tenderness or edema.     Cervical back: Neck supple.     Right lower leg: No edema.     Left lower leg: No edema.  Skin:    General: Skin is warm and dry.     Findings: No rash.  Neurological:     Mental Status: She is alert.     Cranial Nerves: No cranial nerve deficit (no facial droop, extraocular movements intact, no slurred speech).     Sensory: No sensory deficit.     Motor: No abnormal muscle tone or seizure activity.     Coordination: Coordination normal.     Deep Tendon Reflexes: Strength normal.  Psychiatric:        Mood and Affect: Mood and affect normal.     ED Results / Procedures / Treatments   Labs (all labs ordered are listed, but only abnormal results are displayed) Labs Reviewed  BASIC METABOLIC PANEL - Abnormal; Notable for the following components:      Result Value   Glucose, Bld 120 (*)    All other components within normal limits  CBC - Abnormal; Notable for the  following components:   WBC 11.3 (*)    All other components within normal limits  BRAIN NATRIURETIC PEPTIDE  POC SARS CORONAVIRUS 2 AG -  ED    EKG EKG Interpretation  Date/Time:  Sunday April 06 2020 10:09:06 EST Ventricular Rate:  102 PR Interval:  QRS Duration: 87 QT Interval:  327 QTC Calculation: 426 R Axis:   90 Text Interpretation: Sinus tachycardia Prominent P waves, nondiagnostic Borderline right axis deviation Since last tracing rate faster Confirmed by Linwood Dibbles [phone]) on 04/06/2020 12:21:12 PM  Sinus tachycardia rhythm , rate 102 Normal access Normal intervals Normal ST-T waves  Radiology DG Chest Port 1 View  Result Date: 04/06/2020 CLINICAL DATA:  Dyspnea, worsening shortness of breath starting Friday. Productive cough. History of emphysema. EXAM: PORTABLE CHEST 1 VIEW COMPARISON:  Chest x-rays dated 10/19/2019 and 05/19/2019. FINDINGS: Heart size and mediastinal contours are stable. Lungs are hyperexpanded. Chronic bronchitic changes noted centrally. Lungs are otherwise clear. No confluent opacity to suggest a developing pneumonia. No pleural effusion or pneumothorax is seen. Osseous structures about the chest are unremarkable. IMPRESSION: 1. No active disease. No evidence of pneumonia or pulmonary edema. 2. Hyperexpanded lungs indicating COPD. Associated chronic bronchitic changes. Electronically Signed   By: Bary Richard M.D.   On: 04/06/2020 11:29    Procedures Procedures   Medications Ordered in ED Medications  albuterol (VENTOLIN HFA) 108 (90 Base) MCG/ACT inhaler 2 puff (2 puffs Inhalation Given 04/06/20 1104)  ipratropium (ATROVENT HFA) inhaler 2 puff (2 puffs Inhalation Given 04/06/20 1104)  predniSONE (DELTASONE) tablet 60 mg (60 mg Oral Given 04/06/20 1103)  albuterol (PROVENTIL) (2.5 MG/3ML) 0.083% nebulizer solution 5 mg (5 mg Nebulization Given 04/06/20 1337)    ED Course  I have reviewed the triage vital signs and the nursing  notes.  Pertinent labs & imaging results that were available during my care of the patient were reviewed by me and considered in my medical decision making (see chart for details).  Clinical Course as of 04/06/20 1355  Sun Apr 06, 2020  1220 Covid test negative [JK]  1220 Chest x-ray without acute findings [JK]  1313 Pt feeling better.  Will give an additional neb treatment [JK]    Clinical Course User Index [JK] Linwood Dibbles, MD   MDM Rules/Calculators/A&P                          Patient presented to the ED with complaints of shortness of breath.  Patient has history of COPD.  Patient does unfortunately continue to smoke.  In the ED patient's oxygen saturation appears stable.  She does have home O2 available.  Patient improved with breathing treatments.  Covid test is negative and no evidence of pneumonia.  Will discharge home with steroids and continue her breathing treatments at home.  Patient states she does have that medication was does not need a refill. Final Clinical Impression(s) / ED Diagnoses Final diagnoses:  COPD exacerbation (HCC)    Rx / DC Orders ED Discharge Orders         Ordered    predniSONE (DELTASONE) 10 MG tablet        04/06/20 1354           Linwood Dibbles, MD 04/06/20 1356

## 2022-04-25 IMAGING — CT CT CERVICAL SPINE W/O CM
3 of 4 series · 13 of 33 positions shown, 16 images · non-contrast
Comparison: None.

CLINICAL DATA: Pain following trauma.  Altered mental status

EXAM:
CT HEAD WITHOUT CONTRAST
CT CERVICAL SPINE WITHOUT CONTRAST
TECHNIQUE: Multidetector CT imaging of the head and cervical spine was
performed following the standard protocol without intravenous
contrast. Multiplanar CT image reconstructions of the cervical spine
were also generated.

[Series 4: c_spine 2.0 st · axial · 0.32mm/px · z∈[-264,-144]mm · 5 of 90 slices shown, 7 images]
[im 15/90  soft-tissue]
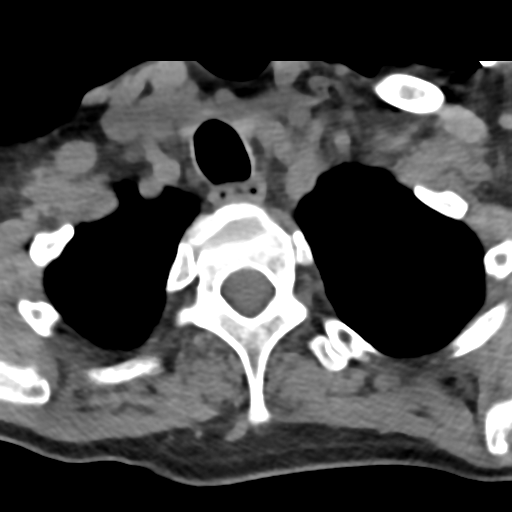
[im 15/90  bone]
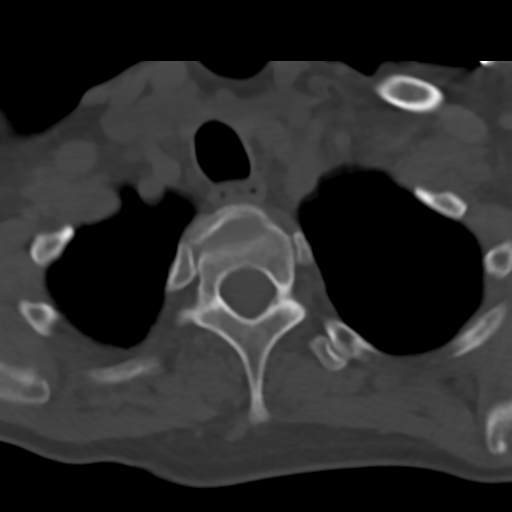
[im 30/90  bone]
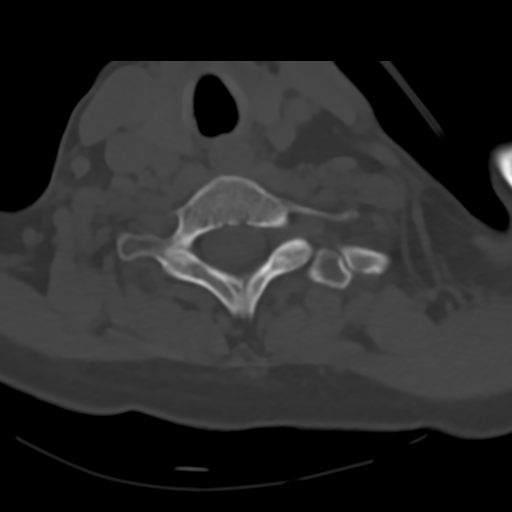
[im 45/90  bone]
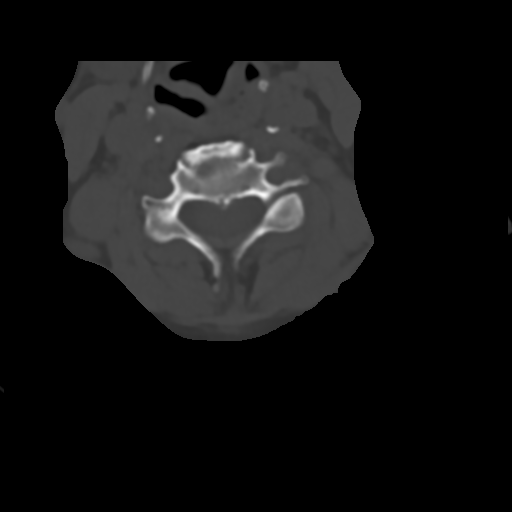
[im 60/90  bone]
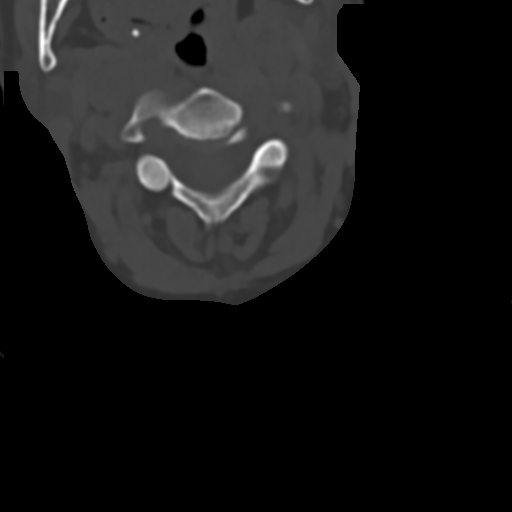
[im 75/90  soft-tissue]
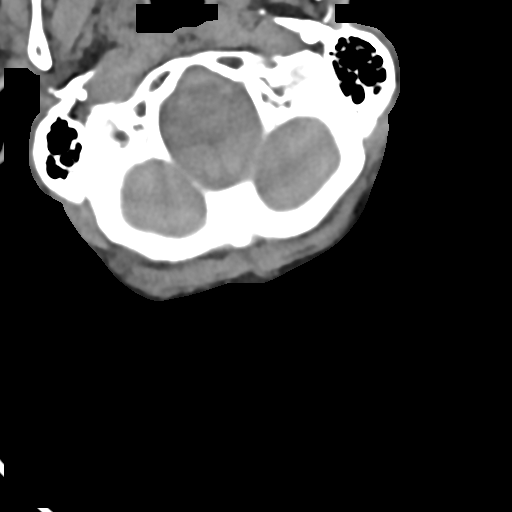
[im 75/90  bone]
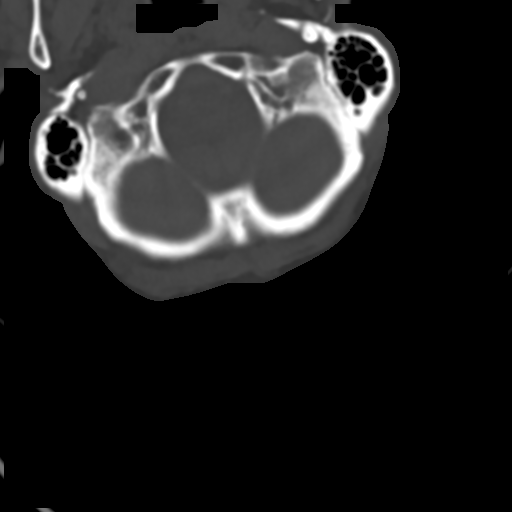

[Series 8: c_spine 2.0 sag bone · sagittal · 0.35mm/px · 5 of 66 slices shown, 6 images]
[im 22/66  bone]
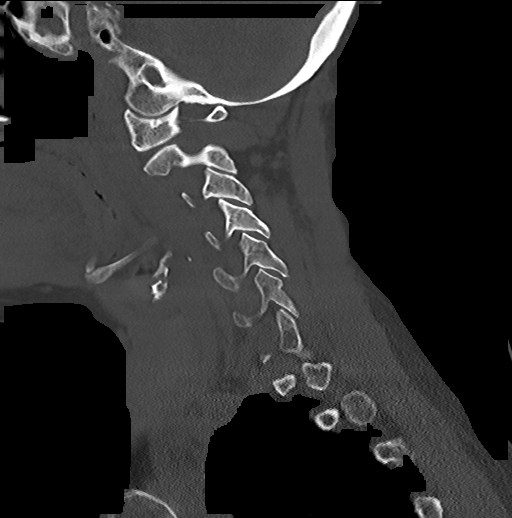
[im 28/66  bone]
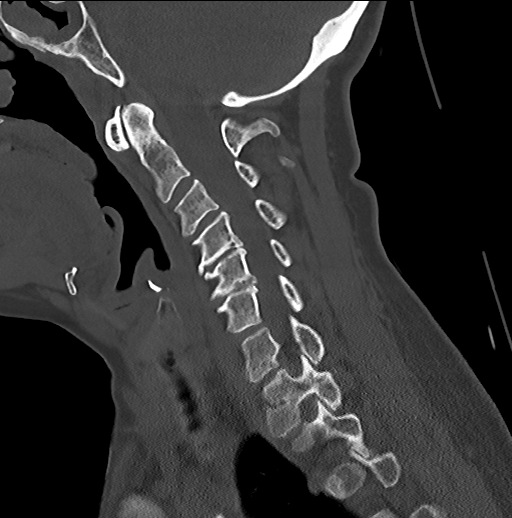
[im 33/66  soft-tissue]
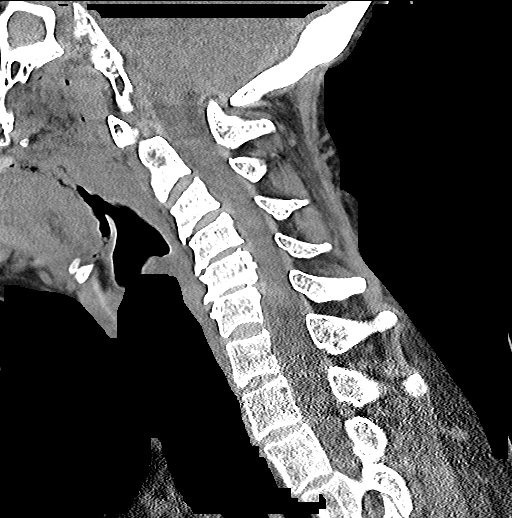
[im 33/66  bone]
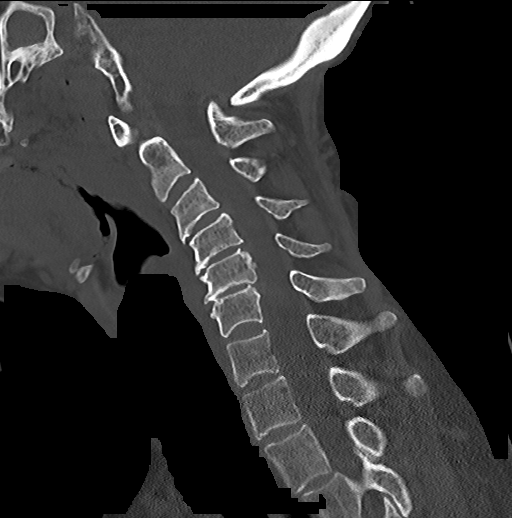
[im 38/66  bone]
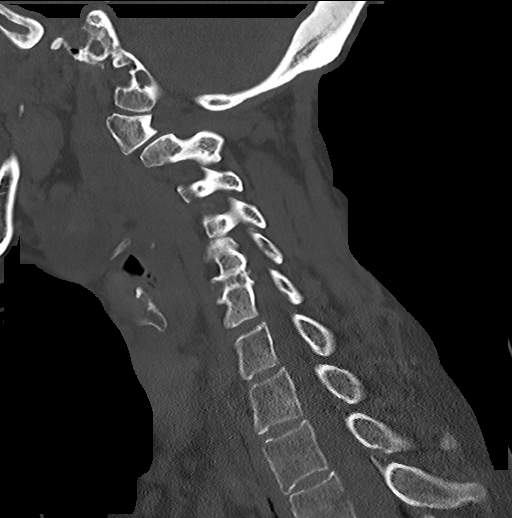
[im 44/66  bone]
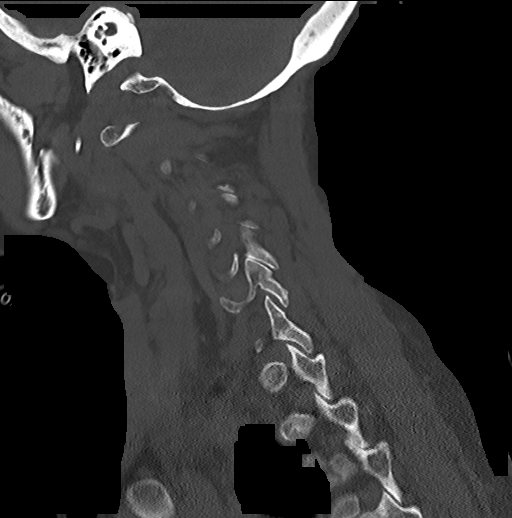

[Series 9: c_spine 2.0 cor bone · coronal · 0.31mm/px · 3 of 70 slices shown]
[im 14/70  bone]
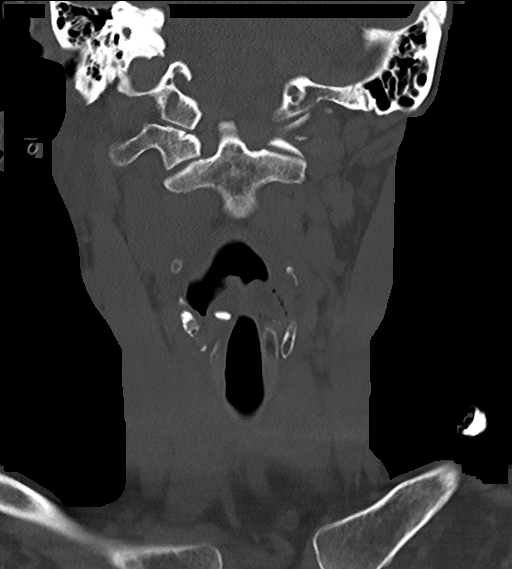
[im 28/70  bone]
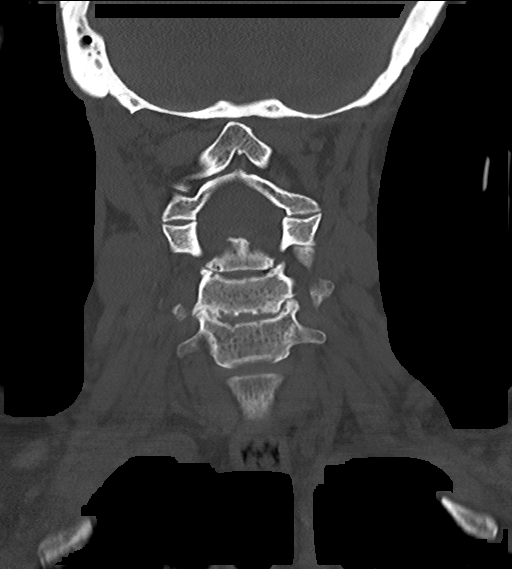
[im 42/70  bone]
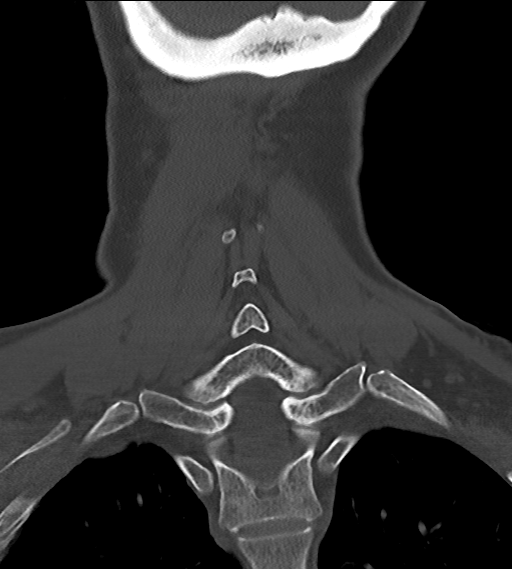

[13 of 33 positions shown; findings below may reference images not displayed]

FINDINGS: CT HEAD FINDINGS

Brain: The ventricles and sulci are normal in size and
configuration. There is no appreciable intracranial mass,
hemorrhage, extra-axial fluid collection, or midline shift. There is
symmetric decreased attenuation in the posterior mid cerebellar
hemispheres bilaterally. There is subtle edema in the posterolateral
aspect of the left parietal lobe. Concern for potential multifocal
infarct given this appearance. Elsewhere brain parenchyma appears
unremarkable.

Vascular: No appreciable hyperdense vessel. There is calcification
in the left carotid siphon.

Skull: The bony calvarium appears intact.

Sinuses/Orbits: There is diffuse opacification of the sphenoid sinus
regions. There is mild mucosal thickening in several ethmoid air
cells. No intraorbital lesions evident.

Other: Mastoid air cells are clear.

CT CERVICAL SPINE FINDINGS

Alignment: There is no appreciable spondylolisthesis.

Skull base and vertebrae: Skull base and craniocervical junction
regions appear normal. No appreciable fracture. No blastic or lytic
bone lesions.

Soft tissues and spinal canal: The prevertebral soft tissues and
predental space regions are normal. There is no evident cord or
canal hematoma. No paraspinous lesions are evident.

Disc levels: There is moderate disc space narrowing at C4-5 and C5-6
with moderate narrowing of the anterior aspect of the C3-4 disc.
There are anterior osteophytes at C3, C4, C5, and C6. There is facet
hypertrophy at multiple levels. There is mild exit foraminal
narrowing at C4-5 on the left with impression on the exiting nerve
root. There is milder impression on the exiting nerve roots at C5-6
bilaterally. There is no frank disc extrusion or stenosis.

Upper chest: There is evidence of centrilobular emphysematous change
in the upper lobes. No upper lung region edema or consolidation.

Other: There are foci of carotid artery calcification bilaterally.
IMPRESSION: CT head: Symmetric decreased attenuation in each posterior mid
cerebellar hemisphere. Suspect edema in the superolateral aspect of
the left parietal lobe. Concern for multifocal infarcts. MR
correlation advised.

No mass or hemorrhage.

Calcification noted in the left carotid siphon region. Paranasal
sinus disease noted with diffuse opacification of the sphenoid sinus
region which may well be chronic given the overall appearance.

CT cervical spine: No fracture or spondylolisthesis. Multilevel
osteoarthritic change. No disc extrusion or stenosis.

Upper lobe emphysematous change noted. Foci of carotid artery
calcification noted bilaterally.

## 2022-10-12 IMAGING — DX DG CHEST 1V PORT
1 series · 1 of 1 positions shown · non-contrast
Comparison: Chest x-rays dated 10/19/2019 and 05/19/2019.

CLINICAL DATA: Dyspnea, worsening shortness of breath starting
[REDACTED]. Productive cough. History of emphysema.

EXAM:
PORTABLE CHEST 1 VIEW

[chest ap]
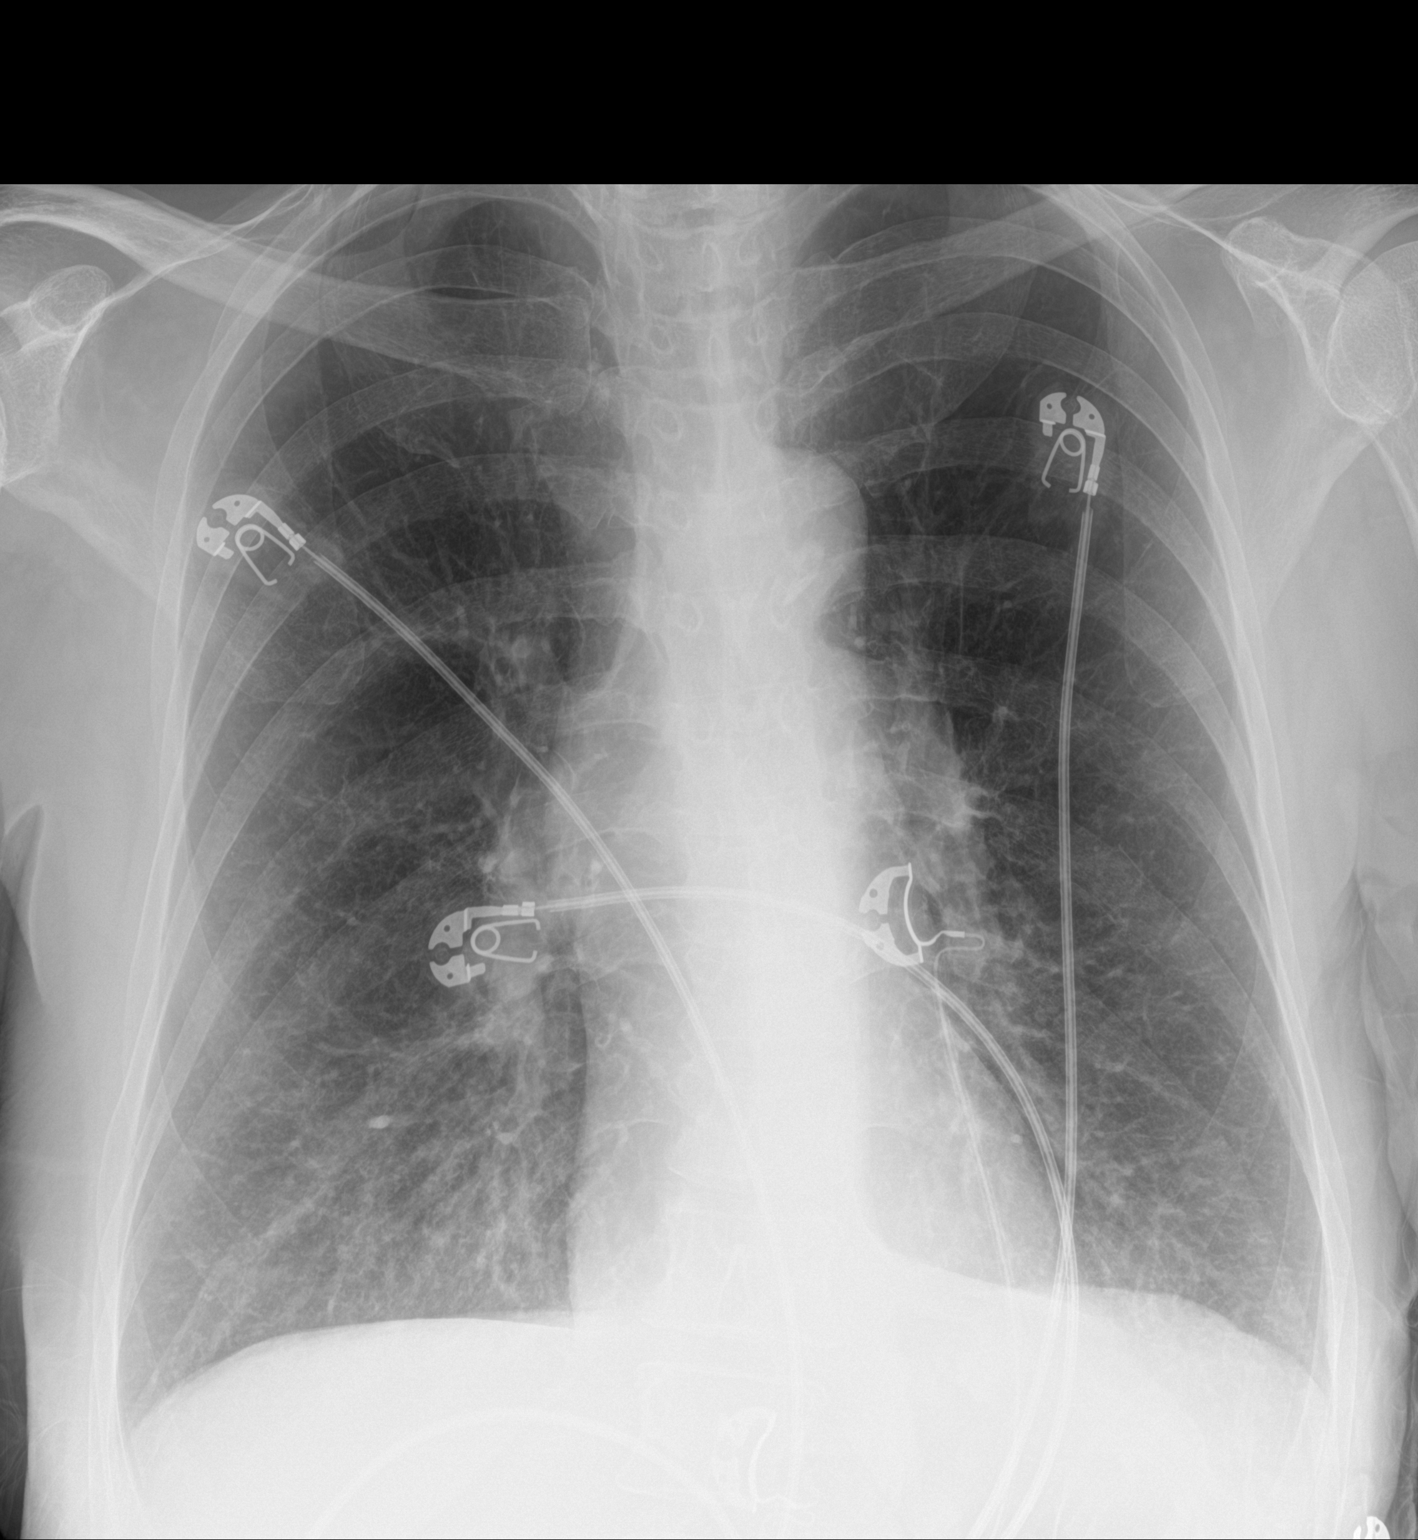

[1 of 1 positions shown; findings below may reference images not displayed]

FINDINGS: Heart size and mediastinal contours are stable. Lungs are
hyperexpanded. Chronic bronchitic changes noted centrally. Lungs are
otherwise clear. No confluent opacity to suggest a developing
pneumonia. No pleural effusion or pneumothorax is seen. Osseous
structures about the chest are unremarkable.
IMPRESSION: 1. No active disease. No evidence of pneumonia or pulmonary edema.
2. Hyperexpanded lungs indicating COPD. Associated chronic
bronchitic changes.
